# Patient Record
Sex: Male | Born: 2000 | Race: Black or African American | Hispanic: No | Marital: Single | Smoking: Never smoker
Health system: Southern US, Community
[De-identification: ages and names within clinical notes are randomized; demographics above are authoritative.]

## PROBLEM LIST (undated history)

## (undated) DIAGNOSIS — F319 Bipolar disorder, unspecified: Secondary | ICD-10-CM

---
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
## 2018-05-19 ENCOUNTER — Other Ambulatory Visit: Payer: Self-pay

## 2018-05-19 ENCOUNTER — Encounter (HOSPITAL_COMMUNITY): Payer: Self-pay | Admitting: *Deleted

## 2018-05-19 ENCOUNTER — Emergency Department (HOSPITAL_COMMUNITY): Payer: Medicaid Other

## 2018-05-19 ENCOUNTER — Emergency Department (HOSPITAL_COMMUNITY)
Admission: EM | Admit: 2018-05-19 | Discharge: 2018-05-19 | Disposition: A | Discharge status: Home / Self Care | Payer: Medicaid Other | Attending: Emergency Medicine | Admitting: Emergency Medicine

## 2018-05-19 DIAGNOSIS — W109XXA Fall (on) (from) unspecified stairs and steps, initial encounter: Secondary | ICD-10-CM | POA: Diagnosis not present

## 2018-05-19 DIAGNOSIS — S93402A Sprain of unspecified ligament of left ankle, initial encounter: Secondary | ICD-10-CM | POA: Insufficient documentation

## 2018-05-19 DIAGNOSIS — Y9301 Activity, walking, marching and hiking: Secondary | ICD-10-CM | POA: Insufficient documentation

## 2018-05-19 DIAGNOSIS — Y998 Other external cause status: Secondary | ICD-10-CM | POA: Diagnosis not present

## 2018-05-19 DIAGNOSIS — Y9289 Other specified places as the place of occurrence of the external cause: Secondary | ICD-10-CM | POA: Diagnosis not present

## 2018-05-19 DIAGNOSIS — S99912A Unspecified injury of left ankle, initial encounter: Secondary | ICD-10-CM | POA: Diagnosis present

## 2018-05-19 MED ORDER — IBUPROFEN 200 MG PO TABS
400.0000 mg | ORAL_TABLET | Freq: Once | ORAL | Status: AC
  Administered 2018-05-19: 400 mg via ORAL
  Filled 2018-05-19: qty 2

## 2018-05-19 MED ORDER — IBUPROFEN 600 MG PO TABS: 600.0000 mg | ORAL_TABLET | Freq: Four times a day (QID) | ORAL | 0 refills | Status: AC | PRN

## 2018-05-19 NOTE — ED Provider Notes (Signed)
[facility]-EMERGENCY DEPT Provider Note   CSN: [id number] Arrival date & time: 05/19/18  1042     History   Chief Complaint Chief Complaint  Patient presents with  . Ankle Pain    HPI Bruce James is a 17 y.o. male who presents to the ED with ankle pain. Patient arrived via EMS and reports he was coming down steps and twisted his left ankle. He reports pain 5/10.   HPI  History reviewed. No pertinent past medical history.  There are no active problems to display for this patient.   History reviewed. No pertinent surgical history.      Home Medications    Prior to Admission medications   Medication Sig Start Date End Date Taking? Authorizing Provider  ibuprofen (ADVIL,MOTRIN) 600 MG tablet Take 1 tablet (600 mg total) by mouth every 6 (six) hours as needed. 05/19/18   Janne Napoleon, NP  naproxen (NAPROSYN) 375 MG tablet Take 1 tablet (375 mg total) by mouth 2 (two) times daily. Prn leg pain 06/10/16   Hayden Rasmussen, NP    Family History No family history on file.  Social History Social History   Tobacco Use  . Smoking status: Never Smoker  . Smokeless tobacco: Never Used  Substance Use Topics  . Alcohol use: No  . Drug use: No     Allergies   Patient has no known allergies.   Review of Systems Review of Systems  Musculoskeletal: Positive for arthralgias and joint swelling.       Left ankle pain  All other systems reviewed and are negative.    Physical Exam Updated Vital Signs BP (!) 131/42 (BP Location: Left Arm)   Pulse 65   Temp 98.8 F (37.1 C) (Oral)   Resp 16   Ht 5\' 10"  (1.778 m)   Wt 83.9 kg   SpO2 100%   BMI 26.54 kg/m   Physical Exam Vitals signs and nursing note reviewed.  Constitutional:      Appearance: He is well-developed.  HENT:     Head: Normocephalic.     Mouth/Throat:     Mouth: Mucous membranes are moist.  Eyes:     Extraocular Movements: Extraocular movements intact.  Neck:   Musculoskeletal: Neck supple.  Cardiovascular:     Rate and Rhythm: Normal rate.  Pulmonary:     Effort: Pulmonary effort is normal.  Musculoskeletal:     Left ankle: He exhibits swelling. He exhibits normal range of motion, no deformity, no laceration and normal pulse. Tenderness. Lateral malleolus tenderness found. Achilles tendon normal.     Comments: Pedal pulse 2+, adequate circulation  Skin:    General: Skin is warm and dry.  Neurological:     Mental Status: He is alert.     Sensory: No sensory deficit.     Motor: No weakness.  Psychiatric:        Mood and Affect: Mood normal.      ED Treatments / Results  Labs (all labs ordered are listed, but only abnormal results are displayed) Labs Reviewed - No data to display  Radiology Dg Ankle Complete Left  Result Date: 05/19/2018 CLINICAL DATA:  Left ankle swelling after injury coming down steps. EXAM: LEFT ANKLE COMPLETE - 3+ VIEW COMPARISON:  None. FINDINGS: There is no evidence of fracture, dislocation, or joint effusion. There is no evidence of arthropathy or other focal bone abnormality. Soft tissue swelling is seen over lateral malleolus. IMPRESSION: No fracture or dislocation is  noted. Soft tissue swelling seen over lateral malleolus. Electronically Signed   By: Lupita RaiderJames  Green Jr, M.D.   On: 05/19/2018 11:35    Procedures Procedures (including critical care time)  Medications Ordered in ED Medications  ibuprofen (ADVIL,MOTRIN) tablet 400 mg (has no administration in time range)     Initial Impression / Assessment and Plan / ED Course  I have reviewed the triage vital signs and the nursing notes. 17 y.o. male here with left ankle pain s/p injury stable for d/c without fracture or dislocation noted on x-ray. ASO, crutches, ice, elevation and ibuprofen. Discussed return precautions. Patient agrees with plan. No focal neuro deficits.   Final Clinical Impressions(s) / ED Diagnoses   Final diagnoses:  Sprain of left  ankle, unspecified ligament, initial encounter    ED Discharge Orders         Ordered    ibuprofen (ADVIL,MOTRIN) 600 MG tablet  Every 6 hours PRN     05/19/18 1215           Kerrie Buffaloeese,  RuffinM, NP 05/19/18 1221    Azalia Bilisampos, Kevin, MD 05/20/18 [phone]

## 2018-05-19 NOTE — ED Notes (Signed)
Pt has left ankle swelling 5/10 pain at rest.  Pt has PMS to extremity.

## 2018-05-19 NOTE — ED Triage Notes (Signed)
EMS states pt was coming down steps and twisted left ankle, #18 LAC, refused pain meds, Pain 5/10 splint applied

## 2018-07-01 ENCOUNTER — Ambulatory Visit (HOSPITAL_COMMUNITY)
Admission: EM | Admit: 2018-07-01 | Discharge: 2018-07-01 | Disposition: A | Discharge status: Home / Self Care | Payer: Medicaid Other | Attending: Internal Medicine | Admitting: Internal Medicine

## 2018-07-01 ENCOUNTER — Ambulatory Visit (INDEPENDENT_AMBULATORY_CARE_PROVIDER_SITE_OTHER): Payer: Medicaid Other

## 2018-07-01 ENCOUNTER — Other Ambulatory Visit: Payer: Self-pay

## 2018-07-01 ENCOUNTER — Encounter (HOSPITAL_COMMUNITY): Payer: Self-pay | Admitting: Emergency Medicine

## 2018-07-01 DIAGNOSIS — S93402A Sprain of unspecified ligament of left ankle, initial encounter: Secondary | ICD-10-CM

## 2018-07-01 DIAGNOSIS — M25572 Pain in left ankle and joints of left foot: Secondary | ICD-10-CM

## 2018-07-01 NOTE — ED Triage Notes (Signed)
The patient presented to the [facility] with a complaint of left ankle pain and swelling after falling down some stairs and then falling during basketball one day ago.

## 2018-07-01 NOTE — Discharge Instructions (Signed)
You may take 500mg Tylenol with ibuprofen 400-600mg every 6 hours for pain and inflammation. °

## 2018-07-01 NOTE — ED Provider Notes (Signed)
  MRN: [id number] DOB: [birth year]  Subjective:   Bruce James is a 18 y.o. male presenting for 2 left ankle injuries, 1 sustained from falling down a few steps and another from playing basketball.  He has since had worsening, moderate aching pain over either side of his left ankle.  Has not tried any medications for pain relief.  He has done some icing. He is not currently taking any medications and has no known food or drug allergies.  Denies past medical and surgical history.  ROS Fever, redness, warmth, bony deformity, loss of sensation, bruising.  Objective:   Vitals: BP (!) 122/52 (BP Location: Right Arm)   Pulse 76   Temp 98.8 F (37.1 C) (Oral)   Resp 16   SpO2 100%   Physical Exam Constitutional:      Appearance: Normal appearance. He is well-developed and normal weight.  HENT:     Head: Normocephalic and atraumatic.     Right Ear: External ear normal.     Left Ear: External ear normal.     Nose: Nose normal.     Mouth/Throat:     Pharynx: Oropharynx is clear.  Eyes:     Extraocular Movements: Extraocular movements intact.     Pupils: Pupils are equal, round, and reactive to light.  Cardiovascular:     Rate and Rhythm: Normal rate.  Pulmonary:     Effort: Pulmonary effort is normal.  Musculoskeletal:     Left ankle: He exhibits decreased range of motion (Mild) and swelling (Mostly over the lateral malleolus). He exhibits no ecchymosis, no deformity, no laceration and normal pulse. Tenderness. Lateral malleolus and medial malleolus tenderness found. Achilles tendon exhibits no pain, no defect and normal Thompson's test results.  Neurological:     Mental Status: He is alert and oriented to person, place, and time.  Psychiatric:        Mood and Affect: Mood normal.        Behavior: Behavior normal.    Dg Ankle Complete Left  Result Date: 07/01/2018 CLINICAL DATA:  Left ankle injury post fall. EXAM: LEFT ANKLE COMPLETE - 3+ VIEW COMPARISON:  None. FINDINGS: There  is no evidence of fracture, dislocation, or joint effusion. There is no evidence of arthropathy or other focal bone abnormality. Soft tissue swelling about the lateral malleolus. IMPRESSION: No acute fracture or dislocation identified about the left ankle. Electronically Signed   By: Ted Mcalpine M.D.   On: 07/01/2018 17:55    Assessment and Plan :   Acute left ankle pain  Sprain of left ankle, unspecified ligament, initial encounter  No fracture noted on preliminary reading, overread is pending.  Will use rice method, provided patient with Ace wrap. Counseled patient on potential for adverse effects with medications prescribed today, patient verbalized understanding. Return-to-clinic precautions discussed, patient verbalized understanding.      Wallis Bamberg, PA-C 07/01/18 1758

## 2018-11-30 ENCOUNTER — Ambulatory Visit: Payer: Medicaid Other | Admitting: Internal Medicine

## 2018-12-01 ENCOUNTER — Encounter (HOSPITAL_COMMUNITY): Payer: Self-pay

## 2018-12-01 ENCOUNTER — Inpatient Hospital Stay (HOSPITAL_COMMUNITY)
Admission: RE | Admit: 2018-12-01 | Discharge: 2018-12-07 | DRG: 885 | Disposition: A | Discharge status: Home / Self Care | Payer: Medicaid Other | Attending: Psychiatry | Admitting: Psychiatry

## 2018-12-01 ENCOUNTER — Other Ambulatory Visit: Payer: Self-pay

## 2018-12-01 DIAGNOSIS — F23 Brief psychotic disorder: Secondary | ICD-10-CM | POA: Insufficient documentation

## 2018-12-01 DIAGNOSIS — F209 Schizophrenia, unspecified: Principal | ICD-10-CM | POA: Diagnosis present

## 2018-12-01 DIAGNOSIS — G4700 Insomnia, unspecified: Secondary | ICD-10-CM | POA: Diagnosis present

## 2018-12-01 DIAGNOSIS — Z1159 Encounter for screening for other viral diseases: Secondary | ICD-10-CM

## 2018-12-01 DIAGNOSIS — F6381 Intermittent explosive disorder: Secondary | ICD-10-CM | POA: Diagnosis present

## 2018-12-01 DIAGNOSIS — F4325 Adjustment disorder with mixed disturbance of emotions and conduct: Secondary | ICD-10-CM | POA: Diagnosis present

## 2018-12-01 MED ORDER — LORAZEPAM 2 MG/ML IJ SOLN
2.0000 mg | Freq: Four times a day (QID) | INTRAMUSCULAR | Status: DC | PRN
  Administered 2018-12-01: 2 mg via INTRAMUSCULAR
  Filled 2018-12-01: qty 1

## 2018-12-01 MED ORDER — HALOPERIDOL 5 MG PO TABS
5.0000 mg | ORAL_TABLET | Freq: Four times a day (QID) | ORAL | Status: DC | PRN
  Administered 2018-12-02 – 2018-12-06 (×4): 5 mg via ORAL
  Filled 2018-12-01 (×5): qty 1

## 2018-12-01 MED ORDER — DIPHENHYDRAMINE HCL 25 MG PO CAPS
50.0000 mg | ORAL_CAPSULE | Freq: Four times a day (QID) | ORAL | Status: DC | PRN
  Administered 2018-12-02 – 2018-12-05 (×4): 50 mg via ORAL
  Filled 2018-12-01 (×4): qty 2

## 2018-12-01 MED ORDER — DIPHENHYDRAMINE HCL 50 MG/ML IJ SOLN
50.0000 mg | Freq: Four times a day (QID) | INTRAMUSCULAR | Status: DC | PRN
  Administered 2018-12-01: 50 mg via INTRAMUSCULAR
  Filled 2018-12-01: qty 1

## 2018-12-01 MED ORDER — TRAZODONE HCL 50 MG PO TABS: 50.0000 mg | ORAL_TABLET | Freq: Every evening | ORAL | Status: DC | PRN

## 2018-12-01 MED ORDER — MAGNESIUM HYDROXIDE 400 MG/5ML PO SUSP: 30.0000 mL | Freq: Every day | ORAL | Status: DC | PRN

## 2018-12-01 MED ORDER — ALUM & MAG HYDROXIDE-SIMETH 200-200-20 MG/5ML PO SUSP: 30.0000 mL | ORAL | Status: DC | PRN

## 2018-12-01 MED ORDER — LORAZEPAM 1 MG PO TABS
2.0000 mg | ORAL_TABLET | Freq: Four times a day (QID) | ORAL | Status: DC | PRN
  Administered 2018-12-02 – 2018-12-06 (×6): 2 mg via ORAL
  Filled 2018-12-01 (×6): qty 2

## 2018-12-01 MED ORDER — HYDROXYZINE HCL 50 MG PO TABS
50.0000 mg | ORAL_TABLET | Freq: Three times a day (TID) | ORAL | Status: DC | PRN
  Administered 2018-12-03 – 2018-12-06 (×5): 50 mg via ORAL
  Filled 2018-12-01 (×4): qty 1

## 2018-12-01 MED ORDER — ACETAMINOPHEN 325 MG PO TABS: 650.0000 mg | ORAL_TABLET | Freq: Four times a day (QID) | ORAL | Status: DC | PRN

## 2018-12-01 MED ORDER — HALOPERIDOL LACTATE 5 MG/ML IJ SOLN
10.0000 mg | Freq: Four times a day (QID) | INTRAMUSCULAR | Status: DC | PRN
  Administered 2018-12-01: 10 mg via INTRAMUSCULAR
  Filled 2018-12-01: qty 2

## 2018-12-01 NOTE — H&P (Signed)
Behavioral Health Medical Screening Exam  Bruce James is an 18 y.o. male.  Total Time spent with patient: 20 minutes  Psychiatric Specialty Exam: Physical Exam  Nursing note and vitals reviewed. Constitutional: He is oriented to person, place, and time. He appears well-developed and well-nourished.  Cardiovascular: Normal rate.  Respiratory: Effort normal.  Musculoskeletal: Normal range of motion.  Neurological: He is alert and oriented to person, place, and time.  Skin: Skin is warm.    Review of Systems  Constitutional: Negative.   HENT: Negative.   Eyes: Negative.   Respiratory: Negative.   Cardiovascular: Negative.   Gastrointestinal: Negative.   Genitourinary: Negative.   Musculoskeletal: Negative.   Skin: Negative.   Neurological: Negative.   Endo/Heme/Allergies: Negative.   Psychiatric/Behavioral: The patient is nervous/anxious.        Anger, homicidal ideations    There were no vitals taken for this visit.There is no height or weight on file to calculate BMI.  General Appearance: Guarded  Eye Contact:  Fair  Speech:  Clear and Coherent and Normal Rate  Volume:  Increased  Mood:  Angry  Affect:  Congruent  Thought Process:  Disorganized and Descriptions of Associations: Tangential  Orientation:  Full (Time, Place, and Person)  Thought Content:  Illogical  Suicidal Thoughts:  No  Homicidal Thoughts:  patient denies but it is reported in IVC paperwork  Memory:  Immediate;   Fair Recent;   Fair  Judgement:  Impaired  Insight:  Lacking  Psychomotor Activity:  Increased  Concentration: Concentration: Poor  Recall:  [place]  Language: Good  Akathisia:  No  Handed:  Right  AIMS (if indicated):     Assets:  Physical Health Social Support  Sleep:       Musculoskeletal: Strength & Muscle Tone: within normal limits Gait & Station: normal Patient leans: N/A  There were no vitals taken for this visit.  Recommendations:  Based on  my evaluation the patient does not appear to have an emergency medical condition.  Lewis Shock, FNP 12/01/2018, 4:49 PM

## 2018-12-01 NOTE — BH Assessment (Signed)
[facility] Assessment Progress Note Case was staffed with Money NP who recommended inpatient admission to assist with stabilization.

## 2018-12-01 NOTE — Progress Notes (Signed)
Pt sleeping at present, no distress noted, calm at present, sedated.  Respirations even and unlabored, skin color good.  Monitoring for safety.

## 2018-12-01 NOTE — Plan of Care (Signed)
[facility] Observation Crisis Plan  Reason for Crisis Plan:  Crisis Stabilization   Plan of Care:  Referral for Telepsychiatry/Psychiatric Consult  Family Support:  Yes   Current Living Environment:  Living Arrangements: Parent  Insurance:   Hospital Account    Name Acct ID Class Status Primary Coverage   [place], [place] [id number] [place]        Guarantor Account (for Hospital Account [id number])    Name Relation to Pt Service Area Active? Acct Type   [place], [place]   Address Phone       [address] [place], [place] [address] [phone])          Coverage Information (for Hospital Account [id number])    F/O Payor/Plan Precert #   [facility] MEDICAID/[place] MEDICAID    Subscriber Subscriber #   [place], [place] [id number]   Address Phone   [address] [place], [place] [address] [phone]      Legal Guardian:  Legal Guardian: (NA)  Primary Care Provider:  Patient, No Pcp Per  Current Outpatient Providers:   Psychiatrist:  Name of Psychiatrist: None  Counselor/Therapist:  Name of Therapist: None  Compliant with Medications:  No medications  Additional Information:   Clarita Crane 6/27/20206:24 PM

## 2018-12-01 NOTE — Progress Notes (Signed)
Pt agitated, anxious, very untrusting of staff.  Paranoid, stating the system is against him, cursing at white staff members.  Staff at bedside to de-escalate pt.   Pt took IM injections without incident.  Pt eating a snack at present.  PA Spencer at bedside to speak with pt.
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
## 2018-12-02 DIAGNOSIS — F4325 Adjustment disorder with mixed disturbance of emotions and conduct: Secondary | ICD-10-CM | POA: Diagnosis not present

## 2018-12-02 DIAGNOSIS — F6381 Intermittent explosive disorder: Secondary | ICD-10-CM

## 2018-12-02 LAB — CBC
HCT: 46 % (ref 39.0–52.0)
Hemoglobin: 15.6 g/dL (ref 13.0–17.0)
MCH: 30.4 pg (ref 26.0–34.0)
MCHC: 33.9 g/dL (ref 30.0–36.0)
MCV: 89.7 fL (ref 80.0–100.0)
Platelets: 273 10*3/uL (ref 150–400)
RBC: 5.13 MIL/uL (ref 4.22–5.81)
RDW: 12.3 % (ref 11.5–15.5)
WBC: 4.1 10*3/uL (ref 4.0–10.5)
nRBC: 0 % (ref 0.0–0.2)

## 2018-12-02 LAB — COMPREHENSIVE METABOLIC PANEL
ALT: 41 U/L (ref 0–44)
AST: 95 U/L — ABNORMAL HIGH (ref 15–41)
Albumin: 5 g/dL (ref 3.5–5.0)
Alkaline Phosphatase: 106 U/L (ref 38–126)
Anion gap: 9 (ref 5–15)
BUN: 10 mg/dL (ref 6–20)
CO2: 25 mmol/L (ref 22–32)
Calcium: 9.5 mg/dL (ref 8.9–10.3)
Chloride: 101 mmol/L (ref 98–111)
Creatinine, Ser: 1.23 mg/dL (ref 0.61–1.24)
GFR calc Af Amer: >60 mL/min (ref >60)
GFR calc non Af Amer: >60 mL/min (ref >60)
Glucose, Bld: 89 mg/dL (ref 70–99)
Potassium: 3.8 mmol/L (ref 3.5–5.1)
Sodium: 135 mmol/L (ref 135–145)
Total Bilirubin: 1.7 mg/dL — ABNORMAL HIGH (ref 0.3–1.2)
Total Protein: 7.9 g/dL (ref 6.5–8.1)

## 2018-12-02 LAB — TSH: TSH: 1.15 u[IU]/mL (ref 0.350–4.500)

## 2018-12-02 LAB — HEMOGLOBIN A1C
Hgb A1c MFr Bld: 5.1 % (ref 4.8–5.6)
Mean Plasma Glucose: 99.67 mg/dL

## 2018-12-02 MED ORDER — TRAZODONE HCL 50 MG PO TABS
50.0000 mg | ORAL_TABLET | Freq: Every day | ORAL | Status: DC
  Administered 2018-12-03 – 2018-12-06 (×4): 50 mg via ORAL
  Filled 2018-12-02 (×4): qty 1

## 2018-12-02 MED ORDER — CARBAMAZEPINE ER 100 MG PO TB12
200.0000 mg | ORAL_TABLET | Freq: Every day | ORAL | Status: DC
  Administered 2018-12-02 – 2018-12-03 (×2): 200 mg via ORAL
  Filled 2018-12-02 (×2): qty 2

## 2018-12-02 NOTE — Progress Notes (Signed)
Patient ID: ERVIE MCCARD, male   DOB: Apr 08, 2001, 18 y.o.   MRN: [id number]   D:Patient has been out of his room in milieu. He was noted punching mattress this morning. Patient does not feel he needs to be in hospital. Patient agreed to take medication this morning. Patient denies SI/HI and A/V hallucinations. Patient engaged very little in conversation with this Probation officer. Patient appears to be preoccupied in thoughts; however denies hearing voices.  A:Patient taking medications as prescribed. Patient provided support and encouragement. Q 15 minute checks in progress.  R:Patient remains safe on unit. Monitoring continues.

## 2018-12-02 NOTE — H&P (Addendum)
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 male that presents this date with IVC. Per IVC: "[name] is hostile and aggressive. The respondent is threatening and states he is going to kill people and break their necks. The respondent cries for long periods of time because of "all the pain in world". [name] punches a concrete wall in his residence "over and over." [name] is a danger to himself and others." Patient presents with a very guarded affect and renders limited history. Patient is difficult to redirect and tangential. Patient is observed to be very agitated and speaks in a loud pressured voice. When asked if patient is oriented patient states "does it matter." Patient denies any S/I but reports ongoing H/I stating he "will kill anyone who is in his way." Patient will not elaborate on content of statement. As this Clinical research associatewriter attempts to gather history patient has a leaf in his pocket which he shows to this Clinical research associatewriter and asks "do you see the beauty of God." Patient makes several religious statements that are unrelated to questions asked. Patient states "there is so much beauty in the world." As this Clinical research associatewriter attempts to gather information patient is observed to attempt to leave the triage area stating "I am done." Patient is observed to be very angry and clinches his fists stating "we are done here." Information to complete assessment was obtained from parent who is present (mother Bruce James [phone]) who renders collateral. Mother states patient is in his  second year at [employer] and is currently on summer break and residing with her. Mother states patient has never had any previous attempts at self harm SA use or prior mental health diagnoses. Mother states that patient's behaviors started to change over two weeks ago when patient suffered a break up with partner. Mother also reports that patient has been extremely upset over the fear of COVID and the [place] incident. Mother reports patient has been isolating and has not slept in over one week. Mother reports that patient has never displayed any aggressive behaviors in the past and has been recently "punching a brick wall outside his home." Mother reports patient has been religiously fixed and often makes bizarre statements that are associated with God and the Bible. Mother states patient as of today showed her a Face book post on how he intends to "kill the Police" over recent events. Mother reports at that time she initiated a IVC. Case was staffed with Money NP who recommended inpatient admission to assist with stabilization.                  .    DUring the evaluation:  Patient was seen and chart reviewed. Patient stated that he believes his neighbor called the police on him because she was worried about his behavior. "I was working out and I was punching the wall on Instagram Live and I guess someone got worried. But that is how I work out. I live with my 8 siblings and they want to go pro so I rough them up and hit on them a lot to get them trong. I also have to work  out to keep myself strong for them. I could have went pro too but I stayed for my brothers. I love my fans I gotta give my fans something to keep them motivated. " Patient denies any depression, anxiety, mood swings, or psychosis. He denies any any inpatient admission or outpatient services. He denies si/hi/avh.   Does contract for safety. No agitation or aggression observed while on the unit.    Associated Signs/Symptoms: Depression  Symptoms:  denies (Hypo) Manic Symptoms:  Elevated Mood, Impulsivity, Irritable Mood, Labiality of Mood, Anxiety Symptoms:  denies Psychotic Symptoms:  denies PTSD Symptoms: Negative Total Time spent with patient: 30 minutes  Past Psychiatric History:None  Is the patient at risk to self? Yes.    Has the patient been a risk to self in the past 6 months? Yes.    Has the patient been a risk to self within the distant past? No.  Is the patient a risk to others? No.  Has the patient been a risk to others in the past 6 months? No.  Has the patient been a risk to others within the distant past? No.   Prior Inpatient Therapy: Prior Inpatient Therapy: No Prior Outpatient Therapy: Prior Outpatient Therapy: No Does patient have an ACCT team?: No Does patient have Intensive In-House Services?  : No Does patient have [facility] services? : No Does patient have P4CC services?: No  Alcohol Screening: 1. How often do you have a drink containing alcohol?: Never 2. How many drinks containing alcohol do you have on a typical day when you are drinking?: 1 or 2 3. How often do you have six or more drinks on one occasion?: Never AUDIT-C Score: 0 4. How often during the last year have you found that you were not able to stop drinking once you had started?: Never 5. How often during the last year have you failed to do what was normally expected from you becasue of drinking?: Never 6. How often during the last year have you needed a first drink in the morning to get yourself going after a heavy drinking session?: Never 7. How often during the last year have you had a feeling of guilt of remorse after drinking?: Never 8. How often during the last year have you been unable to remember what happened the night before because you had been drinking?: Never 9. Have you or someone else been injured as a result of your drinking?: No 10. Has a relative or friend or a doctor or another health worker been concerned  about your drinking or suggested you cut down?: No Alcohol Use Disorder Identification Test Final Score (AUDIT): 0 Substance Abuse History in the last 12 months:  Yes.   Consequences of Substance Abuse: psychosis Previous Psychotropic Medications: No  Psychological Evaluations: No  Past Medical History: History reviewed. No pertinent past medical history. History reviewed. No pertinent surgical history. Family History: History reviewed. No pertinent family history. Family Psychiatric History: as per patient "not that I am aware of but my mom tends to say some crazy things. "  Tobacco Screening:   Social History:  Social History   Substance and Sexual Activity  Alcohol Use No     Social History   Substance and Sexual Activity  Drug Use No    Additional Social History: Marital status: Single    Pain Medications: See MAR Prescriptions: See MAR Over the Counter: See MAR History of alcohol / drug use?: No history of alcohol / drug abuse  Allergies:  No Known Allergies Lab Results:  Results for orders placed or performed during the hospital encounter of 12/01/18 (from the past 48 hour(s))  CBC     Status: None   Collection Time: 12/02/18  7:23 AM  Result Value Ref Range   WBC 4.1 4.0 - 10.5 K/uL   RBC 5.13 4.22 - 5.81 MIL/uL   Hemoglobin 15.6 13.0 - 17.0 g/dL   HCT 46.0 39.0 - 52.0 %   MCV 89.7 80.0 - 100.0 fL   MCH 30.4 26.0 - 34.0 pg   MCHC 33.9 30.0 - 36.0 g/dL   RDW 12.3 11.5 - 15.5 %   Platelets 273 150 - 400 K/uL   nRBC 0.0 0.0 - 0.2 %    Comment: Performed at [facility], [place] [address] [place], [place] [address]  Comprehensive metabolic panel     Status: Abnormal   Collection Time: 12/02/18  7:23 AM  Result Value Ref Range   Sodium 135 135 - 145 mmol/L   Potassium 3.8 3.5 - 5.1 mmol/L   Chloride 101 98 - 111 mmol/L   CO2 25 22 - 32 mmol/L   Glucose, Bld 89 70 - 99 mg/dL   BUN 10 6 - 20 mg/dL   Creatinine, Ser  1.23 0.61 - 1.24 mg/dL   Calcium 9.5 8.9 - 10.3 mg/dL   Total Protein 7.9 6.5 - 8.1 g/dL   Albumin 5.0 3.5 - 5.0 g/dL   AST 95 (H) 15 - 41 U/L   ALT 41 0 - 44 U/L   Alkaline Phosphatase 106 38 - 126 U/L   Total Bilirubin 1.7 (H) 0.3 - 1.2 mg/dL   GFR calc non Af Amer >60 >60 mL/min   GFR calc Af Amer >60 >60 mL/min   Anion gap 9 5 - 15    Comment: Performed at [facility], [address], [place], [place] [address]  TSH     Status: None   Collection Time: 12/02/18  7:23 AM  Result Value Ref Range   TSH 1.150 0.350 - 4.500 uIU/mL    Comment: Performed by a 3rd Generation assay with a functional sensitivity of <=0.01 uIU/mL. Performed at [facility], [address], [place], [place] [address]   Hemoglobin A1c     Status: None   Collection Time: 12/02/18  7:23 AM  Result Value Ref Range   Hgb A1c MFr Bld 5.1 4.8 - 5.6 %    Comment: (NOTE) Pre diabetes:          5.7%-6.4% Diabetes:              >6.4% Glycemic control for   <7.0% adults with diabetes    Mean Plasma Glucose 99.67 mg/dL    Comment: Performed at [facility] [address] [place], [place] [address]    Blood Alcohol level:  No results found for: [facility]  Metabolic Disorder Labs:  Lab Results  Component Value Date   HGBA1C 5.1 12/02/2018   MPG 99.67 12/02/2018   No results found for: PROLACTIN No results found for: CHOL, TRIG, HDL, CHOLHDL, VLDL, LDLCALC  Current Medications: Current Facility-Administered Medications  Medication Dose Route Frequency Provider Last Rate Last Dose  . acetaminophen (TYLENOL) tablet 650 mg  650 mg Oral Q6H PRN Money, Lowry Ram, FNP      . alum & mag hydroxide-simeth (MAALOX/MYLANTA) 200-200-20 MG/5ML suspension 30 mL  30 mL Oral Q4H PRN Money, Lowry Ram, FNP      .  carbamazepine (TEGRETOL XR) 12 hr tablet 200 mg  200 mg Oral Daily Mikah Poss, MD   200 mg at 12/02/18 1247  . diphenhydrAMINE (BENADRYL) capsule 50 mg  50 mg Oral Q6H  PRN Money, Gerlene Burdock, FNP       Or  . diphenhydrAMINE (BENADRYL) injection 50 mg  50 mg Intramuscular Q6H PRN Money, Feliz Beam B, FNP   50 mg at 12/01/18 2032  . haloperidol (HALDOL) tablet 5 mg  5 mg Oral Q6H PRN Money, Gerlene Burdock, FNP       Or  . haloperidol lactate (HALDOL) injection 10 mg  10 mg Intramuscular Q6H PRN Money, Gerlene Burdock, FNP   10 mg at 12/01/18 2032  . hydrOXYzine (ATARAX/VISTARIL) tablet 50 mg  50 mg Oral TID PRN Money, Gerlene Burdock, FNP      . LORazepam (ATIVAN) tablet 2 mg  2 mg Oral Q6H PRN Money, Gerlene Burdock, FNP       Or  . LORazepam (ATIVAN) injection 2 mg  2 mg Intramuscular Q6H PRN Money, Gerlene Burdock, FNP   2 mg at 12/01/18 2032  . magnesium hydroxide (MILK OF MAGNESIA) suspension 30 mL  30 mL Oral Daily PRN Money, Feliz Beam B, FNP      . traZODone (DESYREL) tablet 50 mg  50 mg Oral QHS London Nonaka, MD       PTA Medications: Medications Prior to Admission  Medication Sig Dispense Refill Last Dose  . ibuprofen (ADVIL,MOTRIN) 600 MG tablet Take 1 tablet (600 mg total) by mouth every 6 (six) hours as needed. (Patient not taking: Reported on 12/02/2018) 20 tablet 0 Not Taking at Unknown time  . naproxen (NAPROSYN) 375 MG tablet Take 1 tablet (375 mg total) by mouth 2 (two) times daily. Prn leg pain (Patient not taking: Reported on 12/02/2018) 20 tablet 0 Not Taking at Unknown time    Musculoskeletal: Strength & Muscle Tone: within normal limits Gait & Station: normal Patient leans: N/A  Psychiatric Specialty Exam: Physical Exam  ROS  Blood pressure (!) 162/85, pulse (!) 111, temperature (!) 97.4 F (36.3 C), temperature source Oral, resp. rate 18, SpO2 98 %.There is no height or weight on file to calculate BMI.  General Appearance: Disheveled and purple scrubs torn   Eye Contact:  Fair  Speech:  Clear and Coherent and Pressured  Volume:  Increased  Mood:  Irritable  Affect:  Congruent  Thought Process:  Irrelevant, Linear and Descriptions of Associations: Intact   Orientation:  Full (Time, Place, and Person)  Thought Content:  Delusions and Tangential  Suicidal Thoughts:  No  Homicidal Thoughts:  No  Memory:  Immediate;   Fair Recent;   Fair  Judgement:  Impaired  Insight:  Shallow  Psychomotor Activity:  Normal  Concentration:  Concentration: Fair and Attention Span: Fair  Recall:  [employer] of Knowledge:  Fair  Language:  Fair  Akathisia:  No  Handed:  Right  AIMS (if indicated):     Assets:  Architect Housing Leisure Time Physical Health Social Support Vocational/Educational  ADL's:  Intact  Cognition:  Impaired,  Mild  Sleep:         Treatment Plan Summary: Daily contact with patient to assess and evaluate symptoms and progress in treatment, Medication management and Plan continue with medications and prn agitation protocol as needed. WIll continue to recommend inpatient 500 hall.   Observation Level/Precautions:  15 minute checks Laboratory:  Labs obtained and assessed.  Psychotherapy:  Medications:  Tegretol 200mg  po daily.  Consultations:  Per need Discharge Concerns:  None Estimated LOS:5-7 days Other:      Maryagnes Amosakia S Starkes-Perry, FNP 6/28/20206:20 PM  Patient seen face-to-face for psychiatric evaluation, chart reviewed and case discussed with the physician extender and developed treatment plan. Reviewed the information documented and agree with the treatment plan. Thedore MinsMojeed Reegan Mctighe, MD

## 2018-12-02 NOTE — Progress Notes (Signed)
Pt remains sleeping at present, no distress noted, calm at present.  Monitoring for safety.

## 2018-12-02 NOTE — Progress Notes (Signed)
Patient meets criteria for inpatient treatment. No appropriate or available beds at [facility]. CSW faxed referrals to the following facilities for review:  [facility], [facility], [facility]), [facility], [facility], [facility], and [facility].  TTS will continue to seek bed placement.  Chalmers Guest. Guerry Bruin, MSW, [employer] Work/Disposition Phone: [phone] Fax: [phone]

## 2018-12-03 DIAGNOSIS — F23 Brief psychotic disorder: Secondary | ICD-10-CM | POA: Diagnosis not present

## 2018-12-03 LAB — RAPID URINE DRUG SCREEN, HOSP PERFORMED
Amphetamines: NOT DETECTED
Barbiturates: NOT DETECTED
Benzodiazepines: NOT DETECTED
Cocaine: NOT DETECTED
Opiates: NOT DETECTED
Tetrahydrocannabinol: NOT DETECTED

## 2018-12-03 LAB — URINALYSIS, ROUTINE W REFLEX MICROSCOPIC
Bilirubin Urine: NEGATIVE
Glucose, UA: NEGATIVE mg/dL
Hgb urine dipstick: NEGATIVE
Ketones, ur: NEGATIVE mg/dL
Leukocytes,Ua: NEGATIVE
Nitrite: NEGATIVE
Protein, ur: NEGATIVE mg/dL
Specific Gravity, Urine: 1.001 — ABNORMAL LOW (ref 1.005–1.030)
pH: 6 (ref 5.0–8.0)

## 2018-12-03 MED ORDER — HALOPERIDOL 5 MG PO TABS
5.0000 mg | ORAL_TABLET | Freq: Every day | ORAL | Status: DC
  Administered 2018-12-03 – 2018-12-06 (×4): 5 mg via ORAL
  Filled 2018-12-03 (×5): qty 1

## 2018-12-03 NOTE — Progress Notes (Signed)
Provided specimen cup for urine sample when he can.  Cup was placed in his bathroom with instructions.  We will continue to encourage specimen.
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
## 2018-12-03 NOTE — Progress Notes (Signed)
Bruce James has been up and down all shift.  He would come out randomly telling staff that he was mad because he couldn't have a "rolly chair" and that he couldn't have melatonin at 4am.  He remained restless and bizarre.  Offered more prn's at 4am but pt declined.

## 2018-12-03 NOTE — Progress Notes (Signed)
Pt mother, Joelene Millin, called and reports that he called her at 7pm this evening and she reports that he is sounding better and talking more clearly.

## 2018-12-03 NOTE — Progress Notes (Signed)
D:  Trevontae was up and pacing the hall much of the evening.  He had odd intimidating posture while pacing the unit.  He was noted standing in his room naked and opened the door with blanket covering his groin area.  He was initially receptive to medication and speaking with RN, but became agitated and verbally aggressive when RN asked to show under his tongue for cheeking.  RN would not leave side while tech had to get in a warm cup of water cause "cold water hurts my teeth."  He was verbally aggressive and making derogatory state blaming RN for having cold water.  He did spit some of the disintegrated pills in his hand but did finally take the medication.  He is currently resting with his eyes closed and appears to be asleep. A:  1:1 with RN for support and encouragement.  Medications as ordered.  Q 15 minute checks maintained for safety.  R:  Mordche remains safe on the unit.  We will continue to monitor the progress towards his goals.

## 2018-12-03 NOTE — Progress Notes (Signed)
Pt has been up and down.  He has been bizarre, walking like a robot, pretending to sleep walk into the back door, fake snoring and asking staff if they want to "race."  Medication had minimal effect.  We will continue to monitor.
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
## 2018-12-04 ENCOUNTER — Encounter (HOSPITAL_COMMUNITY): Payer: Self-pay | Admitting: Registered Nurse

## 2018-12-04 ENCOUNTER — Inpatient Hospital Stay: Admission: AD | Admit: 2018-12-04 | Payer: Medicaid Other | Admitting: Psychiatry

## 2018-12-04 LAB — SARS CORONAVIRUS 2 BY RT PCR (HOSPITAL ORDER, PERFORMED IN ~~LOC~~ HOSPITAL LAB): SARS Coronavirus 2: NEGATIVE

## 2018-12-04 NOTE — Tx Team (Signed)
Initial Treatment Plan 12/04/2018 11:09 PM Bruce James APO:[id number]    PATIENT STRESSORS: Marital or family conflict Substance abuse   PATIENT STRENGTHS: General fund of knowledge Physical Health Supportive family/friends   PATIENT IDENTIFIED PROBLEMS: Psychosis  Agitation/aggression  "I need help working on my anger issues"                 DISCHARGE CRITERIA:  Improved stabilization in mood, thinking, and/or behavior Need for constant or close observation no longer present Verbal commitment to aftercare and medication compliance  PRELIMINARY DISCHARGE PLAN: Outpatient therapy Medication management  PATIENT/FAMILY INVOLVEMENT: This treatment plan has been presented to and reviewed with the patient, Bruce James.  The patient and family have been given the opportunity to ask questions and make suggestions.  Windell Moment, RN 12/04/2018, 11:09 PM

## 2018-12-04 NOTE — Plan of Care (Signed)
[name] Observation Crisis Plan  Reason for Crisis Plan:  Crisis Stabilization   Plan of Care:  Referral for Inpatient Hospitalization  Family Support:      Current Living Environment:  Living Arrangements: Parent  Insurance:   Hospital Account    Name Acct ID Class Status Primary Coverage   Bruce James, Bruce James [id number] [place]        Guarantor Account (for Hospital Account [id number])    Name Relation to Pt Service Area Active? Acct Type   [name], [place]   Address Phone       [address] [place], [place] [address] [phone])          Coverage Information (for Hospital Account [id number])    F/O Payor/Plan Precert #   [facility] MEDICAID/[place]ICA[place]    Subscriber Subscriber #   Bruce James [id number]   Address Phone   [address] [place], [place] [address] [phone]      Legal Guardian:  Legal Guardian: (NA)  Primary Care Provider:  Patient, No Pcp Per  Current Outpatient Providers:  Unknown at this time.   Psychiatrist:  Name of Psychiatrist: None  Counselor/Therapist:  Name of Therapist: None  Compliant with Medications:  Yes  Additional Information:   Bruce James 6/30/20204:42 PM

## 2018-12-04 NOTE — Progress Notes (Signed)
Bruce James is a 18 year old male being transferred from [facility] Obs unit to 507-1.  He came in for psychosis and agitation after being IVC'd by his mother.  During Obs stay, he continued to exhibit bizarre behavior, pacing, robotic walking and irritability.  He takes his PO medications without difficulty.  He denied SI/HI or A/V hallucinations.  Oriented him to the unit.  Admission paperwork completed and signed.  Belongings searched and secured in locker # 36, no contraband found.  Skin assessment completed and no skin issues noted.  Q 15 minute checks initiated for safety.  We will continue to monitor the progress towards his goals.

## 2018-12-04 NOTE — Progress Notes (Addendum)
Covid Swab collected 12/04/18 at 1310. AC made aware as inpatient admission onto adult unit is pending at this time.

## 2018-12-04 NOTE — Progress Notes (Signed)
D:  Kolsen continues to be labile and disorganized.  He remains bizarre in his mannerisms (pacing with eyes closed, robotic walking and walking into door at end of the hall before turning to go into his room).  Argumentative and demanding to see the doctor so he can leave.  He continually threatening to sue the hospital. He was agreeable to take hs medications with prn vistaril.  They were ineffective and continued to pace the floors with one arm over his head with angry look on his face.  He complained that the medications didn't help him sleep but was refusing more medications.  He agreed later to more prns for agitation and anxiety.  He was finally able to lay down in his bed.  He is currently resting with his eyes closed and appears to be asleep.  A:  1:1 with RN for support and encouragement.  Medications as ordered.  Q 15 minute checks maintained for safety.   R:  Kenniel remains safe on the unit.  We will continue to monitor the progress towards his goals.

## 2018-12-04 NOTE — Discharge Summary (Addendum)
  Patient transferred for Observation to [facility] inpatient for psychiatric treatment  Shuvon B. Rankin, NP   Patient's chart reviewed. Reviewed the information documented and agree with the treatment plan.  Buford Dresser, DO 12/04/18 6:54 PM

## 2018-12-04 NOTE — BHH Counselor (Signed)
Spoke with patient's mother, Joelene Millin. She reports "He spoke very clear and alert when I spoke with him. He spoke even clearer today." States she would like him to be discharged. This clinician reminded her that it was up to the psychiatrist ad nurse practitioner to determine if patient was stable enough for discharge.

## 2018-12-04 NOTE — Progress Notes (Signed)
   12/03/18 2136  COVID-19 Daily Checkoff  Have you had a fever (temp > 37.80C/100F)  in the past 24 hours?  No  If you have had runny nose, nasal congestion, sneezing in the past 24 hours, has it worsened? No  COVID-19 EXPOSURE  Have you traveled outside the state in the past 14 days? No  Have you been in contact with someone with a confirmed diagnosis of COVID-19 or PUI in the past 14 days without wearing appropriate PPE? No  Have you been living in the same home as a person with confirmed diagnosis of COVID-19 or a PUI (household contact)? No  Have you been diagnosed with COVID-19? No

## 2018-12-04 NOTE — Progress Notes (Signed)
[place] NOVEL CORONAVIRUS (COVID-19) DAILY CHECK-OFF SYMPTOMS - answer yes or no to each - every day NO YES  Have you had a fever in the past 24 hours?  . Fever (Temp > 37.80C / 100F) X   Have you had any of these symptoms in the past 24 hours? . New Cough .  Sore Throat  .  Shortness of Breath .  Difficulty Breathing .  Unexplained Body Aches   X   Have you had any one of these symptoms in the past 24 hours not related to allergies?   . Runny Nose .  Nasal Congestion .  Sneezing   X   If you have had runny nose, nasal congestion, sneezing in the past 24 hours, has it worsened?  X   EXPOSURES - check yes or no X   Have you traveled outside the state in the past 14 days?  X   Have you been in contact with someone with a confirmed diagnosis of COVID-19 or PUI in the past 14 days without wearing appropriate PPE?  X   Have you been living in the same home as a person with confirmed diagnosis of COVID-19 or a PUI (household contact)?    X   Have you been diagnosed with COVID-19?    X              What to do next: Answered NO to all: Answered YES to anything:   Proceed with unit schedule Follow the [facility] Inpatient Flowsheet.   

## 2018-12-04 NOTE — Progress Notes (Signed)
D: Patient is observed throughout the day walking through the hallway. Gait is slow but steady. Patient tends to close his eyes during ambulation at times, though reopens his eyes when asked. Patient is ambulatory and smiling. Patient has spoken to his Mother on the phone several times throughout the day. Patient has eaten at all scheduled meal times and frequently requests for his water pitcher to be refilled with warm water. Patient also asked for a heat pack for complaints of L sided back pain with relief. PRN vistaril given this afternoon. At present, patient denies any SI/HI/AVH.   A: Support and encouragement provided. Routine safety checks conducted every 15 minutes per unit protocol. Encouraged to notify if thoughts of harm toward self or others arise. Patient agrees.   R: Patient remains safe at this time. Will continue to monitor.

## 2018-12-04 NOTE — Progress Notes (Signed)
Pt accepted to [facility], 507[facility]1  Shuvon Rankin, NP is the accepting provider.  Johnn Hai, MD is the attending provider.  Call report to [phone]  @ [facility] notified.   Pt is IVC  Pt scheduled  to arrive at [place].  Areatha Keas. Judi Cong, MSW, [place] Disposition Clinical Social Work [phone] (cell) [phone] (office) .

## 2018-12-05 DIAGNOSIS — F209 Schizophrenia, unspecified: Secondary | ICD-10-CM | POA: Diagnosis present

## 2018-12-05 DIAGNOSIS — G4700 Insomnia, unspecified: Secondary | ICD-10-CM | POA: Diagnosis present

## 2018-12-05 DIAGNOSIS — F4325 Adjustment disorder with mixed disturbance of emotions and conduct: Secondary | ICD-10-CM | POA: Diagnosis present

## 2018-12-05 DIAGNOSIS — F6381 Intermittent explosive disorder: Secondary | ICD-10-CM | POA: Diagnosis present

## 2018-12-05 DIAGNOSIS — F23 Brief psychotic disorder: Secondary | ICD-10-CM | POA: Diagnosis not present

## 2018-12-05 DIAGNOSIS — Z1159 Encounter for screening for other viral diseases: Secondary | ICD-10-CM | POA: Diagnosis not present

## 2018-12-05 MED ORDER — PRENATAL MULTIVITAMIN CH
1.0000 | ORAL_TABLET | Freq: Every day | ORAL | Status: DC
  Administered 2018-12-05 – 2018-12-07 (×3): 1 via ORAL
  Filled 2018-12-05 (×4): qty 1

## 2018-12-05 MED ORDER — LORAZEPAM 2 MG/ML IJ SOLN: 4.0000 mg | Freq: Four times a day (QID) | INTRAMUSCULAR | Status: DC | PRN

## 2018-12-05 MED ORDER — RISPERIDONE 3 MG PO TABS
3.0000 mg | ORAL_TABLET | Freq: Two times a day (BID) | ORAL | Status: DC
  Administered 2018-12-05 – 2018-12-06 (×3): 3 mg via ORAL
  Filled 2018-12-05 (×6): qty 1

## 2018-12-05 MED ORDER — BENZTROPINE MESYLATE 1 MG PO TABS
1.0000 mg | ORAL_TABLET | Freq: Two times a day (BID) | ORAL | Status: DC
  Administered 2018-12-05 – 2018-12-07 (×4): 1 mg via ORAL
  Filled 2018-12-05 (×6): qty 1

## 2018-12-05 MED ORDER — OMEGA-3-ACID ETHYL ESTERS 1 G PO CAPS
1.0000 g | ORAL_CAPSULE | Freq: Two times a day (BID) | ORAL | Status: DC
  Administered 2018-12-05 – 2018-12-07 (×4): 1 g via ORAL
  Filled 2018-12-05 (×6): qty 1

## 2018-12-05 MED ORDER — TEMAZEPAM 30 MG PO CAPS
30.0000 mg | ORAL_CAPSULE | Freq: Every day | ORAL | Status: DC
  Administered 2018-12-05 – 2018-12-06 (×2): 30 mg via ORAL
  Filled 2018-12-05 (×2): qty 2

## 2018-12-05 NOTE — Plan of Care (Signed)
Progress note  Pt found in bed; compliant with medication administration. Pt ambulates with his eyes halfway closed. Pt seems to do this on purpose, as he will change his behavior at will. Pt denies any physical pain or symptoms. Pt states he has never been suicidal or had avh. Pt denies si/hi/ah/vh and verbally agrees to approach staff if these become apparent or before harming himself/others while at [facility]. Pt provided support and encouragement. Pt given medication per protocol and standing orders. Q73m safety checks implemented and continued. Will continue to monitor.   Pt progressing in the following metrics  Problem: Education: Goal: Knowledge of [place] [facility] Education information/materials will improve Outcome: Progressing Goal: Emotional status will improve Outcome: Progressing Goal: Mental status will improve Outcome: Progressing Goal: Verbalization of understanding the information provided will improve Outcome: Progressing

## 2018-12-05 NOTE — Progress Notes (Signed)
Recreation Therapy Notes  Date: 7.1.20 Time: 1015 Location: 500 Hall Dayroom  Group Topic: Communication, Team Building, Problem Solving  Goal Area(s) Addresses:  Patient will effectively work with peer towards shared goal.  Patient will identify skill used to make activity successful.  Patient will identify how skills used during activity can be used to reach post d/c goals.   Intervention: STEM Activity   Activity:  [employer].  Patients were in groups of three.  Patients were given 12 pipe cleaners.  Patients were to build a free standing tower as tall as possible.  During the course of the activity, there will be budget cuts and certain skills will be prohibited from use.   Education: Education officer, community, Dentist.   Education Outcome: Acknowledges education/In group clarification offered/Needs additional education.   Clinical Observations/Feedback:  Pt did not attend group.    Victorino Sparrow, LRT/CTRS         Victorino Sparrow A 12/05/2018 11:34 AM

## 2018-12-05 NOTE — Progress Notes (Signed)
Per MHT, Pt observe pacing in room naked. Pt was told to put clothes on per courtesy of staff doing Q 15. Pt appears increasingly bizarre and irritable. Pt states he cannot sleep. Pt observe sitting on the floor in the hallway. For this reason, PRN ativan, vistaril, and benadryl was given. Will follow up.
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
## 2018-12-05 NOTE — [facility] Suicide Risk Assessment (Signed)
[facility] Admission Suicide Risk Assessment   Nursing information obtained from:  Patient Demographic factors:  Male Current Mental Status:  NA Loss Factors:  Decline in physical health Historical Factors:  Impulsivity Risk Reduction Factors:  Living with another person, especially a relative, Sense of responsibility to family, Positive social support, Positive therapeutic relationship  Total Time spent with patient: 45 minutes Principal Problem: Acute psychosis ([place]) Diagnosis:  Principal Problem:   Acute psychosis ([place]) Active Problems:   Adjustment disorder with mixed disturbance of emotions and conduct   Intermittent explosive disorder  Subjective Data: Admitted for new onset behavioral changes psychosis threats towards others and hyper religiosity Continued Clinical Symptoms:  Alcohol Use Disorder Identification Test Final Score (AUDIT): 0 The "Alcohol Use Disorders Identification Test", Guidelines for Use in Primary Care, Second Edition.  World Pharmacologist [facility]). Score between 0-7:  no or low risk or alcohol related problems. Score between 8-15:  moderate risk of alcohol related problems. Score between 16-19:  high risk of alcohol related problems. Score 20 or above:  warrants further diagnostic evaluation for alcohol dependence and treatment.   CLINICAL FACTORS:   Schizophrenia:   Less than 33 years old   Musculoskeletal: Strength & Muscle Tone: within normal limits Gait & Station: normal Patient leans: N/A  Psychiatric Specialty Exam: Physical Exam only stiff in appearance but no EPS blood pressure slightly up 121/91  ROS neurological negative for head trauma or seizures/negative for substance abuse/cardiac negative for issues, GI/GU negative for issues on general screening.  Blood pressure (!) 121/91, pulse 78, temperature 98.5 F (36.9 C), temperature source Oral, resp. rate 18, height 6' (1.829 m), weight 79.4 kg, SpO2 99 %.Body mass index is 23.73 kg/m.   General Appearance: Casual  Eye Contact:  Good  Speech:  Clear and Coherent  Volume:  Monotone  Mood:  Euthymic  Affect:  Blunt  Thought Process:  Irrelevant and Descriptions of Associations: Loose  Orientation:  Other:  Person place general situation and year  Thought Content:  Illogical, Delusions and Paranoid Ideation  Suicidal Thoughts:  No  Homicidal Thoughts:  Yes.  without intent/plan had made homicidal threats prior to admission denies now states he "loves everybody" speaks in hyper religious terms  Memory:  Immediate;   Fair  Judgement:  Impaired  Insight:  Shallow  Psychomotor Activity:  Decreased  Concentration:  Concentration: Fair  Recall:  [place] of Knowledge:  Fair  Language: Monotone but normal volume  Akathisia:  Negative  Handed:  Right  AIMS (if indicated):     Assets:  Physical Health Resilience Social Support Talents/Skills  ADL's:  Intact  Cognition:  WNL  Sleep:  Number of Hours: 5.75         COGNITIVE FEATURES THAT CONTRIBUTE TO RISK:  Loss of executive function    SUICIDE RISK:   Mild:  HI/no si - of limited frequency, intensity, duration, and specificity.  There are no identifiable plans, no associated intent, mild dysphoria and related symptoms, good self-control (both objective and subjective assessment), few other risk factors, and identifiable protective factors, including available and accessible social support.  PLAN OF CARE: see eval  I certify that inpatient services furnished can reasonably be expected to improve the patient's condition.   Johnn Hai, MD 12/05/2018, 11:14 AM

## 2018-12-05 NOTE — Progress Notes (Signed)
Recreation Therapy Notes  INPATIENT RECREATION THERAPY ASSESSMENT  Patient Details Name: Bruce James MRN: [id number] DOB: 2000/09/01 Today's Date: 12/05/2018       Information Obtained From: Patient  Able to Participate in Assessment/Interview: Yes  Patient Presentation: (Drowsy)  Reason for Admission (Per Patient): Other (Comments)(Temper)  Patient Stressors: Other (Comment)(Wants everyone to like him)  Coping Skills:   Journal, TV, Arguments, Music, Exercise, Meditate, Deep Breathing, Talk, Art, Prayer, Avoidance, Read  Leisure Interests (2+):  Individual - Reading, Sports - Basketball, Exercise - Lifting Weights, Community - Other (Comment)(Karate)  Frequency of Recreation/Participation: Other (Comment)(Daily)  Awareness of Community Resources:  Yes  Community Resources:  Park  Current Use: Yes  If no, Barriers?:    Expressed Interest in [place]: No  County of Residence:  [place]  Patient Main Form of Transportation: Bicycle  Patient Strengths:  Love for people; Determination  Patient Identified Areas of Improvement:  Anger  Patient Goal for Hospitalization:  "become a better version of me"  Current SI (including self-harm):  No  Current HI:  No  Current AVH: No  Staff Intervention Plan: Group Attendance, Collaborate with Interdisciplinary Treatment Team  Consent to Intern Participation: N/A   Victorino Sparrow, LRT/CTRS  Victorino Sparrow A 12/05/2018, 12:32 PM

## 2018-12-05 NOTE — Progress Notes (Signed)
Progress note  Pt presented to the nurses station at shift change. "I just want to sleep". Pt appeared drowsy and unsteady. Pt provided medication and assisted to bed. Pt has been resting since. Pt denied any physical pain, only complaining of insomnia. Pt safe on the unit. q61m safety checks implemented and continued. Pt provided support and encouragement. Pt given medication per protocol and standing orders. Will continue to monitor.

## 2018-12-05 NOTE — Tx Team (Signed)
Interdisciplinary Treatment and Diagnostic Plan Update  12/05/2018 Time of Session: 09:25am Bruce James MRN: [id number]  Principal Diagnosis: Acute psychosis [name])  Secondary Diagnoses: Principal Problem:   Acute psychosis ([name]) Active Problems:   Adjustment disorder with mixed disturbance of emotions and conduct   Intermittent explosive disorder   Current Medications:  Current Facility-Administered Medications  Medication Dose Route Frequency Provider Last Rate Last Dose  . acetaminophen (TYLENOL) tablet 650 mg  650 mg Oral Q6H PRN Money, Lowry Ram, FNP      . alum & mag hydroxide-simeth (MAALOX/MYLANTA) 200-200-20 MG/5ML suspension 30 mL  30 mL Oral Q4H PRN Money, Darnelle Maffucci B, FNP      . diphenhydrAMINE (BENADRYL) capsule 50 mg  50 mg Oral Q6H PRN Money, Lowry Ram, FNP   50 mg at 12/05/18 0116   Or  . diphenhydrAMINE (BENADRYL) injection 50 mg  50 mg Intramuscular Q6H PRN Money, Darnelle Maffucci B, FNP   50 mg at 12/01/18 2032  . haloperidol (HALDOL) tablet 5 mg  5 mg Oral Q6H PRN Money, Lowry Ram, FNP   5 mg at 12/04/18 0107   Or  . haloperidol lactate (HALDOL) injection 10 mg  10 mg Intramuscular Q6H PRN Money, Lowry Ram, FNP   10 mg at 12/01/18 2032  . haloperidol (HALDOL) tablet 5 mg  5 mg Oral QHS Buford Dresser J, DO   5 mg at 12/04/18 2200  . hydrOXYzine (ATARAX/VISTARIL) tablet 50 mg  50 mg Oral TID PRN Money, Lowry Ram, FNP   50 mg at 12/05/18 0920  . LORazepam (ATIVAN) tablet 2 mg  2 mg Oral Q6H PRN Money, Lowry Ram, FNP   2 mg at 12/05/18 0920   Or  . LORazepam (ATIVAN) injection 2 mg  2 mg Intramuscular Q6H PRN Money, Lowry Ram, FNP   2 mg at 12/01/18 2032  . magnesium hydroxide (MILK OF MAGNESIA) suspension 30 mL  30 mL Oral Daily PRN Money, Lowry Ram, FNP      . traZODone (DESYREL) tablet 50 mg  50 mg Oral QHS [name], [name], MD   50 mg at 12/04/18 2201   PTA Medications: Medications Prior to Admission  Medication Sig Dispense Refill Last Dose  . ibuprofen (ADVIL,MOTRIN) 600  MG tablet Take 1 tablet (600 mg total) by mouth every 6 (six) hours as needed. (Patient not taking: Reported on 12/02/2018) 20 tablet 0 Not Taking at Unknown time  . naproxen (NAPROSYN) 375 MG tablet Take 1 tablet (375 mg total) by mouth 2 (two) times daily. Prn leg pain (Patient not taking: Reported on 12/02/2018) 20 tablet 0 Not Taking at Unknown time    Patient Stressors: Marital or family conflict Substance abuse  Patient Strengths: General fund of knowledge Physical Health Supportive family/friends  Treatment Modalities: Medication Management, Group therapy, Case management,  1 to 1 session with clinician, Psychoeducation, Recreational therapy.   Physician Treatment Plan for Primary Diagnosis: Acute psychosis ([name]) Long Term Goal(s):     Short Term Goals:    Medication Management: Evaluate patient's response, side effects, and tolerance of medication regimen.  Therapeutic Interventions: 1 to 1 sessions, Unit Group sessions and Medication administration.  Evaluation of Outcomes: Progressing  Physician Treatment Plan for Secondary Diagnosis: Principal Problem:   Acute psychosis ([name]) Active Problems:   Adjustment disorder with mixed disturbance of emotions and conduct   Intermittent explosive disorder  Long Term Goal(s):     Short Term Goals:       Medication Management: Evaluate patient's response, side  effects, and tolerance of medication regimen.  Therapeutic Interventions: 1 to 1 sessions, Unit Group sessions and Medication administration.  Evaluation of Outcomes: Progressing   RN Treatment Plan for Primary Diagnosis: Acute psychosis ([name]) Long Term Goal(s): Knowledge of disease and therapeutic regimen to maintain health will improve  Short Term Goals: Ability to participate in decision making will improve, Ability to verbalize feelings will improve, Ability to disclose and discuss suicidal ideas, Ability to identify and develop effective coping behaviors will  improve and Compliance with prescribed medications will improve  Medication Management: RN will administer medications as ordered by provider, will assess and evaluate patient's response and provide education to patient for prescribed medication. RN will report any adverse and/or side effects to prescribing provider.  Therapeutic Interventions: 1 on 1 counseling sessions, Psychoeducation, Medication administration, Evaluate responses to treatment, Monitor vital signs and CBGs as ordered, Perform/monitor CIWA, COWS, AIMS and Fall Risk screenings as ordered, Perform wound care treatments as ordered.  Evaluation of Outcomes: Progressing   LCSW Treatment Plan for Primary Diagnosis: Acute psychosis ([name]) Long Term Goal(s): Safe transition to appropriate next level of care at discharge, Engage patient in therapeutic group addressing interpersonal concerns.  Short Term Goals: Engage patient in aftercare planning with referrals and resources and Increase skills for wellness and recovery  Therapeutic Interventions: Assess for all discharge needs, 1 to 1 time with Social worker, Explore available resources and support systems, Assess for adequacy in community support network, Educate family and significant other(s) on suicide prevention, Complete Psychosocial Assessment, Interpersonal group therapy.  Evaluation of Outcomes: Progressing   Progress in Treatment: Attending groups: No. Participating in groups: No. Taking medication as prescribed: Yes. Toleration medication: Yes. Family/Significant other contact made: No, will contact:  will contact if given consent to contact Patient understands diagnosis: No. Discussing patient identified problems/goals with staff: Yes. Medical problems stabilized or resolved: Yes. Denies suicidal/homicidal ideation: Yes. Issues/concerns per patient self-inventory: No. Other:   New problem(s) identified: No, Describe:  None  New Short Term/Long Term Goal(s):  Medication stabilization, elimination of SI thoughts, and development of a comprehensive mental wellness plan.   Patient Goals:  "Defeat the spirit of hate and racism in the world"  Discharge Plan or Barriers: CSW will continue to follow up for appropriate referrals and possible discharge planning  Reason for Continuation of Hospitalization: Aggression Hallucinations Medication stabilization  Estimated Length of Stay: 3-5 days  Attendees: Patient: 12/05/2018   Physician: Dr. Malvin JohnsBrian Farah, MD 12/05/2018  Nursing: Casimiro NeedleMichael, RN 12/05/2018  RN Care Manager: 12/05/2018   Social Worker: Stephannie PetersJasmine Cortez Steelman, LCSW 12/05/2018   Recreational Therapist:  12/05/2018  Other:  12/05/2018  Other:  12/05/2018   Other: 12/05/2018       Scribe for Treatment Team: Delphia GratesJasmine M Ramses Klecka, LCSW 12/05/2018 10:09 AM

## 2018-12-05 NOTE — Progress Notes (Signed)
Pt brought to the adult unit from the observation unit. All paperwork completed in the observation unit.

## 2018-12-06 MED ORDER — VITAMIN B-12 1000 MCG PO TABS
1000.0000 ug | ORAL_TABLET | Freq: Every day | ORAL | Status: DC
  Administered 2018-12-06 – 2018-12-07 (×2): 1000 ug via ORAL
  Filled 2018-12-06 (×4): qty 1

## 2018-12-06 NOTE — Progress Notes (Signed)
Nursing Progress Note: 7p-7a D: Pt currently presents with a pleasant/flat affect and behavior. Interacting minimally with the milieu. Pt reports good sleep during the previous night with current medication regimen.  A: Pt provided with medications per providers orders. Pt's labs and vitals were monitored throughout the night. Pt supported emotionally and encouraged to express concerns and questions. Pt educated on medications.  R: Pt's safety ensured with 15 minute and environmental checks. Pt currently denies SI, HI, and AVH. Pt verbally contracts to seek staff if SI,HI, or AVH occurs and to consult with staff before acting on any harmful thoughts. Will continue to monitor.   [place] NOVEL CORONAVIRUS (COVID-19) DAILY CHECK-OFF SYMPTOMS - answer yes or no to each - every day NO YES  Have you had a fever in the past 24 hours?  . Fever (Temp > 37.80C / 100F) X   Have you had any of these symptoms in the past 24 hours? . New Cough .  Sore Throat  .  Shortness of Breath .  Difficulty Breathing .  Unexplained Body Aches   X   Have you had any one of these symptoms in the past 24 hours not related to allergies?   . Runny Nose .  Nasal Congestion .  Sneezing   X   If you have had runny nose, nasal congestion, sneezing in the past 24 hours, has it worsened?  X   EXPOSURES - check yes or no X   Have you traveled outside the state in the past 14 days?  X   Have you been in contact with someone with a confirmed diagnosis of COVID-19 or PUI in the past 14 days without wearing appropriate PPE?  X   Have you been living in the same home as a person with confirmed diagnosis of COVID-19 or a PUI (household contact)?    X   Have you been diagnosed with COVID-19?    X              What to do next: Answered NO to all: Answered YES to anything:   Proceed with unit schedule Follow the [facility] Inpatient Flowsheet.

## 2018-12-06 NOTE — [facility] Group Notes (Signed)
[place] LCSW Group Therapy Note  Date/Time: 12/06/2018 @ 1:00pm  Type of Therapy/Topic:  Group Therapy:  Feelings about Diagnosis  Participation Level:  Minimal   Mood: Pleasant   Description of Group:    This group will allow patients to explore their thoughts and feelings about diagnoses they have received. Patients will be guided to explore their level of understanding and acceptance of these diagnoses. Facilitator will encourage patients to process their thoughts and feelings about the reactions of others to their diagnosis, and will guide patients in identifying ways to discuss their diagnosis with significant others in their lives. This group will be process-oriented, with patients participating in exploration of their own experiences as well as giving and receiving support and challenge from other group members.   Therapeutic Goals: 1. Patient will demonstrate understanding of diagnosis as evidence by identifying two or more symptoms of the disorder:  2. Patient will be able to express two feelings regarding the diagnosis 3. Patient will demonstrate ability to communicate their needs through discussion and/or role plays  Summary of Patient Progress:   Pt attended group therapy. Pt did not engage much during group therapy. Pt slept most of the group but did state that having a mental health and physical health diagnosis is a natural balance. Pt was able to identify different stigmas associated with a mental health diagnosis which is: being a psycho/threat.     Therapeutic Modalities:   Cognitive Behavioral Therapy Brief Therapy Feelings Identification   Ardelle Anton, LCSW

## 2018-12-06 NOTE — Progress Notes (Signed)
[facility] MD Progress Note  12/06/2018 11:21 AM Bruce James  MRN:  [id number] Subjective:    Patient very focused on discharge calling his hospitalization "enslavement" stating his anger is under control and he needs to go.  Spoke with his mother with his permission.  She elaborated that his aunt had a history of alcoholism and was diagnosed with substance-induced mood disorder bipolar type that is they thought she had bipolar when she was under the influence or coming off of alcohol but nothing else runs in the family patient had previously been quite high functioning. Discussed neuroprotective measures in addition to acute treatment Patient's mother also elaborated that about 2 weeks ago patient had blocked friends on Facebook made some aggressive statements and threats even to even a gang member and then backed off and apologize for his behaviors and that is what he refers to as anger management issues Principal Problem: Acute psychosis ([place]) Diagnosis: Principal Problem:   Acute psychosis ([place]) Active Problems:   Adjustment disorder with mixed disturbance of emotions and conduct   Intermittent explosive disorder  Total Time spent with patient: 20 minutes  Past Psychiatric History: neg  Past Medical History: History reviewed. No pertinent past medical history. History reviewed. No pertinent surgical history. Family History: History reviewed. No pertinent family history. Family Psychiatric  History: neg Social History: nl high functioning Social History   Substance and Sexual Activity  Alcohol Use No     Social History   Substance and Sexual Activity  Drug Use No    Social History   Socioeconomic History  . Marital status: Single    Spouse name: Not on file  . Number of children: Not on file  . Years of education: Not on file  . Highest education level: Not on file  Occupational History  . Not on file  Social Needs  . Financial resource strain: Not on file  . Food  insecurity    Worry: Not on file    Inability: Not on file  . Transportation needs    Medical: Not on file    Non-medical: Not on file  Tobacco Use  . Smoking status: Never Smoker  . Smokeless tobacco: Never Used  Substance and Sexual Activity  . Alcohol use: No  . Drug use: No  . Sexual activity: Not on file  Lifestyle  . Physical activity    Days per week: Not on file    Minutes per session: Not on file  . Stress: Not on file  Relationships  . Social Herbalist on phone: Not on file    Gets together: Not on file    Attends religious service: Not on file    Active member of club or organization: Not on file    Attends meetings of clubs or organizations: Not on file    Relationship status: Not on file  Other Topics Concern  . Not on file  Social History Narrative  . Not on file   Additional Social History:    Pain Medications: See MAR Prescriptions: See MAR Over the Counter: See MAR History of alcohol / drug use?: No history of alcohol / drug abuse                    Sleep: Good  Appetite:  Good  Current Medications: Current Facility-Administered Medications  Medication Dose Route Frequency Provider Last Rate Last Dose  . acetaminophen (TYLENOL) tablet 650 mg  650 mg Oral Q6H PRN Money,  Gerlene Burdockravis B, FNP      . alum & mag hydroxide-simeth (MAALOX/MYLANTA) 200-200-20 MG/5ML suspension 30 mL  30 mL Oral Q4H PRN Money, Feliz Beamravis B, FNP      . benztropine (COGENTIN) tablet 1 mg  1 mg Oral BID Malvin JohnsFarah, Walsie Smeltz, MD   1 mg at 12/06/18 0743  . diphenhydrAMINE (BENADRYL) capsule 50 mg  50 mg Oral Q6H PRN Money, Gerlene Burdockravis B, FNP   50 mg at 12/05/18 1924   Or  . diphenhydrAMINE (BENADRYL) injection 50 mg  50 mg Intramuscular Q6H PRN Money, Feliz Beamravis B, FNP   50 mg at 12/01/18 2032  . haloperidol (HALDOL) tablet 5 mg  5 mg Oral Q6H PRN Money, Gerlene Burdockravis B, FNP   5 mg at 12/05/18 1646   Or  . haloperidol lactate (HALDOL) injection 10 mg  10 mg Intramuscular Q6H PRN Money, Gerlene Burdockravis  B, FNP   10 mg at 12/01/18 2032  . haloperidol (HALDOL) tablet 5 mg  5 mg Oral QHS Juanetta BeetsNorman, Jacqueline J, DO   5 mg at 12/05/18 2155  . hydrOXYzine (ATARAX/VISTARIL) tablet 50 mg  50 mg Oral TID PRN Money, Gerlene Burdockravis B, FNP   50 mg at 12/05/18 0920  . LORazepam (ATIVAN) tablet 2 mg  2 mg Oral Q6H PRN Money, Gerlene Burdockravis B, FNP   2 mg at 12/05/18 1924   Or  . LORazepam (ATIVAN) injection 2 mg  2 mg Intramuscular Q6H PRN Money, Gerlene Burdockravis B, FNP   2 mg at 12/01/18 2032  . LORazepam (ATIVAN) injection 4 mg  4 mg Intramuscular Q6H PRN Malvin JohnsFarah, Jamie-Lee Galdamez, MD      . magnesium hydroxide (MILK OF MAGNESIA) suspension 30 mL  30 mL Oral Daily PRN Money, Gerlene Burdockravis B, FNP      . omega-3 acid ethyl esters (LOVAZA) capsule 1 g  1 g Oral BID Malvin JohnsFarah, Yamilette Garretson, MD   1 g at 12/06/18 0743  . prenatal multivitamin tablet 1 tablet  1 tablet Oral Daily Malvin JohnsFarah, Ayme Short, MD   1 tablet at 12/06/18 [phone]  . risperiDONE (RISPERDAL) tablet 3 mg  3 mg Oral BID Malvin JohnsFarah, Zaeden Lastinger, MD   3 mg at 12/06/18 0743  . temazepam (RESTORIL) capsule 30 mg  30 mg Oral QHS Malvin JohnsFarah, Zeniya Lapidus, MD   30 mg at 12/05/18 2155  . traZODone (DESYREL) tablet 50 mg  50 mg Oral QHS [name], [name], MD   50 mg at 12/05/18 2155    Lab Results: No results found for this or any previous visit (from the past 48 hour(s)).  Blood Alcohol level:  No results found for: [facility]  Metabolic Disorder Labs: Lab Results  Component Value Date   HGBA1C 5.1 12/02/2018   MPG 99.67 12/02/2018   No results found for: PROLACTIN No results found for: CHOL, TRIG, HDL, CHOLHDL, VLDL, LDLCALC  Physical Findings: AIMS: Facial and Oral Movements Muscles of Facial Expression: None, normal Lips and Perioral Area: None, normal Jaw: None, normal Tongue: None, normal,Extremity Movements Upper (arms, wrists, hands, fingers): None, normal Lower (legs, knees, ankles, toes): None, normal, Trunk Movements Neck, shoulders, hips: None, normal, Overall Severity Severity of abnormal movements (highest score from  questions above): None, normal Incapacitation due to abnormal movements: None, normal Patient's awareness of abnormal movements (rate only patient's report): No Awareness, Dental Status Current problems with teeth and/or dentures?: No Does patient usually wear dentures?: No  CIWA:  CIWA-Ar Total: 8 COWS:  COWS Total Score: 7  Musculoskeletal: Strength & Muscle Tone: within normal limits Gait & Station: normal  Patient leans: N/A  Psychiatric Specialty Exam: Physical Exam  ROS  Blood pressure (!) 94/48, pulse 81, temperature 98.5 F (36.9 C), temperature source Oral, resp. rate 16, height 6' (1.829 m), weight 79.4 kg, SpO2 99 %.Body mass index is 23.73 kg/m.  General Appearance: Casual  Eye Contact:  Good  Speech:  Clear and Coherent  Volume:  Decreased  Mood:  Irritable  Affect:  Congruent  Thought Process:  Goal Directed and Descriptions of Associations: Tangential  Orientation:  Full (Time, Place, and Person)  Thought Content:  Illogical and Delusions  Suicidal Thoughts:  No  Homicidal Thoughts:  No  Memory:  Immediate;   Fair  Judgement:  Impaired  Insight:  Shallow  Psychomotor Activity:  Normal  Concentration:  Concentration: Fair  Recall:  FiservFair  Fund of Knowledge:  Fair  Language:  Fair  Akathisia:  Negative  Handed:  Right  AIMS (if indicated):     Assets:  Physical Health Resilience  ADL's:  Intact  Cognition:  WNL  Sleep:  Number of Hours: 6.25    Treatment Plan Summary: Daily contact with patient to assess and evaluate symptoms and progress in treatment and Medication management Probable discharge tomorrow.  Discussed antioxidants omega-3's and B vitamins.  Malvin JohnsFARAH,Waynette Towers, MD 12/06/2018, 11:21 AM

## 2018-12-06 NOTE — Plan of Care (Signed)
Progress note  Pt found in the hallway; compliant with medication administration. Pt had to be instructed to dress appropriately as he was disrobing. Pt compliant. Pt still feels like he is "slavery". Pt hasn't made these statements after going outside. Pt denies si/hi/ah/vh and verbally agrees to approach staff if these become apparent or before harming himself/others while at [place]: Pt provided support and encouragement. Pt given medication per protocol and standing orders. Q33m safety checks implemented and continued.  R: Pt safe on the unit. Will continue to monitor.   Pt progressing in the following metrics  Problem: Activity: Goal: Interest or engagement in activities will improve Outcome: Progressing Goal: Sleeping patterns will improve Outcome: Progressing   Problem: Coping: Goal: Ability to verbalize frustrations and anger appropriately will improve Outcome: Progressing Goal: Ability to demonstrate self-control will improve Outcome: Progressing

## 2018-12-06 NOTE — [facility] Suicide Risk Assessment (Signed)
[place] INPATIENT:  Family/Significant Other Suicide Prevention Education  Suicide Prevention Education:  Education Completed; Pt's mother, Bruce James, has been identified by the patient as the family member/significant other with whom the patient will be residing, and identified as the person(s) who will aid the patient in the event of a mental health crisis (suicidal ideations/suicide attempt).  With written consent from the patient, the family member/significant other has been provided the following suicide prevention education, prior to the and/or following the discharge of the patient.  The suicide prevention education provided includes the following:  Suicide risk factors  Suicide prevention and interventions  National Suicide Hotline telephone number  [facility] assessment telephone number  [facility] Emergency Assistance [place] and/or Residential Mobile Crisis Unit telephone number  Request made of family/significant other to:  Remove weapons (e.g., guns, rifles, knives), all items previously/currently identified as safety concern.    Remove drugs/medications (over-the-counter, prescriptions, illicit drugs), all items previously/currently identified as a safety concern.  The family member/significant other verbalizes understanding of the suicide prevention education information provided.  The family member/significant other agrees to remove the items of safety concern listed above.  CSW contacted pt's mother, Bruce James. Pt's mother stated that she wants the pt to be able to leave the hospital and come home with a plan. She states that she wants some help with him at home. Pt's mother stated that this is a first incident of these behaviors and she does not know of any resources. Pt's mother stated that he needs a doctor/psychiatrist and therapist for his mental health but he also needs a PCP. Pt's mother stated that he was going somewhere but  they could not take him because he turned 60. Pt's mother states that she wants someone who can work with his age (prefers a family physician). Pt's mother states that he has medicaid. Pt's mother states that she wants the pt to be able to come home over the weekend and has been trying to get ahold of the doctor and has not been successful. Pt's mother states that whatever he wants for aftercare that she will agree with she just wants him to get the help that he needs and come home.   Trecia Rogers 12/06/2018, 10:02 AM

## 2018-12-06 NOTE — Progress Notes (Signed)
Patient ID: Bruce James, male   DOB: 03-Aug-2000, 18 y.o.   MRN: [id number]   CSW met with patient to ask him about permission to talk to his mother. Pt gave permission/consent to speak with his mother.

## 2018-12-06 NOTE — BHH Counselor (Signed)
Adult Comprehensive Assessment  Patient ID: Bruce James, male   DOB: 08/25/2000, 18 y.o.   MRN: [id number]  Information Source: Information source: Patient  Current Stressors:  Patient states their primary concerns and needs for treatment are:: "I feel like it was my anger issues and sometimes I get too stressed out and I get anger" Patient states their goals for this hospitilization and ongoing recovery are:: "Make it the best and defeat the spirit of racism" Educational / Learning stressors: Pt is a sophomore at [employer]. 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 Family Relationships: Pt denies stressors Financial / Lack of resources (include bankruptcy): Pt reports that his dad is poor. Housing / Lack of housing: Pt denies stressors Physical health (include injuries & life threatening diseases): Pt denies stressors Social relationships: Pt denies stressors Substance abuse: Pt denies stressors Bereavement / Loss: Pt denies stressors  Living/Environment/Situation:  Living Arrangements: Children, Parent Living conditions (as described by patient or guardian): "I love living with my families" Who else lives in the home?: Mom and 8 younger siblings How long has patient lived in current situation?: "My whole life" What is atmosphere in current home: Comfortable, Supportive, Loving  Family History:  Marital status: Single Are you sexually active?: ("whatever you think") What is your sexual orientation?: Heterosexual Has your sexual activity been affected by drugs, alcohol, medication, or emotional stress?: No Does patient have children?: No  Childhood History:  By whom was/is the patient raised?: Mother, Mother/father and step-parent Additional childhood history information: "Dad was in the hood in [place]" Description of patient's relationship with caregiver when they were a child: "Good. Me and my mom got a good  life" Patient's description of current relationship with people who raised him/her: "Still good. She thinks I am going to be a little boy my whole life" How were you disciplined when you got in trouble as a child/adolescent?: Whoopings Does patient have siblings?: Yes Number of Siblings: 8 Description of patient's current relationship with siblings: "I love my siblings" Did patient suffer any verbal/emotional/physical/sexual abuse as a child?: No Did patient suffer from severe childhood neglect?: No Has patient ever been sexually abused/assaulted/raped as an adolescent or adult?: No Was the patient ever a victim of a crime or a disaster?: No Witnessed domestic violence?: No Has patient been effected by domestic violence as an adult?: No  Education:  Highest grade of school patient has completed: Some college Currently a student?: Yes Name of school: [employer] person: NA How long has the patient attended?: 2 years Learning disability?: No  Employment/Work Situation:   Employment situation: Employed Where is patient currently employed?: 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 How long has patient been employed?: 2 years Patient's job has been impacted by current illness: No What is the longest time patient has a held a job?: 2 years Where was the patient employed at that time?: With his mom Did You Receive Any Psychiatric Treatment/Services While in the [employer]?: No Are There Guns or Other Weapons in [place]?: No  Financial Resources:   Museum/gallery curator resources: Medicaid, Support from parents / caregiver Does patient have a Programmer, applications or guardian?: No  Alcohol/Substance Abuse:   What has been your use of drugs/alcohol within the last 12 months?: Pt denies substance use. If attempted suicide, did drugs/alcohol play a role in this?: No Alcohol/Substance Abuse Treatment Hx: Denies past history Has alcohol/substance  abuse ever caused legal problems?:  No  Social Support System:   Patient's Community Support System: Good Describe Community Support System: Mom, stepdad, and dad. Type of faith/religion: "Believe in God" How does patient's faith help to cope with current illness?: "I can feel God's spirit"  Leisure/Recreation:   Leisure and Hobbies: Read, write goals down, work out, play basketball, and train brothers to go pro  Strengths/Needs:   What is the patient's perception of their strengths?: Speaking/communication and good at sports. Patient states they can use these personal strengths during their treatment to contribute to their recovery: "I've already recovered" Patient states these barriers may affect/interfere with their treatment: N/A Patient states these barriers may affect their return to the community: N/A Other important information patient would like considered in planning for their treatment: N/A  Discharge Plan:   Currently receiving community mental health services: No Patient states concerns and preferences for aftercare planning are: [facility] for med management and therapy. [facility] for PCP doctor Patient states they will know when they are safe and ready for discharge when: "Ready to go now" Does patient have access to transportation?: Yes(mom) Does patient have financial barriers related to discharge medications?: No Will patient be returning to same living situation after discharge?: Yes(back with mom and siblings)  Summary/Recommendations:   Summary and Recommendations (to be completed by the evaluator): Pt is a 18 year old male who presents with a 2-week history of new onset psychosis, hyper religious statements and violent threats. Pt's diagnosis is: Acute psychosis ([name]). Recommendations for pt include: crisis stabilization, therapeutic milieu, medication management, attend and participate in group therapy, and development of a comprehensive mental wellness plan.  Delphia GratesJasmine M Michala Deblanc.  12/06/2018

## 2018-12-06 NOTE — Progress Notes (Signed)
Recreation Therapy Notes  Date: 7.2.20 Time: 1000 Location: 500 Hall Dayroom  Group Topic: Anger Management  Goal Area(s) Addresses:  Patient will identify triggers for anger.  Patient will identify a situation that makes them angry.  Patient will identify what other emotions comes with anger.   Behavioral Response:  Engaged  Intervention: Worksheet  Activity:  Introduction to Anger Management.  Patients were to identify three situations that lead to anger, what they do when angry and what problems have they run into because of anger.  Education: Anger Management, Discharge Planning   Education Outcome: Acknowledges education/In group clarification offered/Needs additional education.   Clinical Observations/Feedback: Patient stated identified dumb questions and people calling him weird make him angry.  Patient expressed when he gets angry he thinks about only getting his point through and shouts.  Patient stated his anger has not cause him any problems.     Bruce James, LRT/CTRS         Ria Comment, Vann Okerlund A 12/06/2018 10:49 AM

## 2018-12-06 NOTE — BHH Group Notes (Signed)
Occupational Therapy Group Note  Date:  12/06/2018 Time:  1:37 PM  Group Topic/Focus:  Leisure Group  Participation Level:  Active  Participation Quality:  Redirectable  Affect:  Flat  Cognitive:  Disorganized  Insight: Lacking  Engagement in Group:  Developing/Improving  Modes of Intervention:  Activity, Discussion, Education and Socialization  Additional Comments:    S: "Listen to understand before you respond"  O: OT group focus on leisure this date, while incorporating coping skills to ensure understanding. Pts to play game of Uno: and name a struggle they're facing, a communication skill, something they like about yourself, stress management, and a healthy way to manage anger per certain cards played. Pt also assessed for attention, ability to follow rules, and temperament with rules.   A: Pt presents to flat affect, appearing disorganized throughout session. When redirected, his statements were more appropriate and pertaining to topic. At times during group he would stand up and pace or stand up and put both hands on the window until he was cued to join the activity again. Pt appears supportive of other group members, but at time crossing some personal boundaries by offering to "meet the people who hurt them".  P: OT group will be x1 per week while pt inpatient.  Zenovia Jarred, MSOT, OTR/L Behavioral Health OT/ Acute Relief OT PHP Office: [place] 12/06/2018, 1:37 PM

## 2018-12-07 MED ORDER — BENZTROPINE MESYLATE 1 MG PO TABS: 1.0000 mg | ORAL_TABLET | Freq: Two times a day (BID) | ORAL | 2 refills | Status: AC

## 2018-12-07 MED ORDER — OMEGA-3-ACID ETHYL ESTERS 1 G PO CAPS: 1.0000 g | ORAL_CAPSULE | Freq: Two times a day (BID) | ORAL | 2 refills | Status: AC

## 2018-12-07 MED ORDER — RISPERIDONE 3 MG PO TABS: ORAL_TABLET | ORAL | 1 refills | Status: AC

## 2018-12-07 MED ORDER — ENLYTE PO CAPS: ORAL_CAPSULE | ORAL | 11 refills | Status: AC

## 2018-12-07 MED ORDER — TEMAZEPAM 30 MG PO CAPS: 30.0000 mg | ORAL_CAPSULE | Freq: Every evening | ORAL | 0 refills | Status: AC | PRN

## 2018-12-07 NOTE — Plan of Care (Signed)
Pt was able to identify benefits of using appropriate anger management techniques.    Victorino Sparrow, LRT/CTRS

## 2018-12-07 NOTE — Progress Notes (Signed)
Recreation Therapy Notes  Date: 7.3.20 Time: 1000 Location: 500 Hall Dayroom  Group Topic: Goal Setting  Goal Area(s) Addresses:  Patient will be able to identify at least 3 life goals.  Patient will be able to identify benefit of investing in life goals.  Patient will be able to identify benefit of setting life goals.   Behavioral Response:  Engaged  Intervention:  Worksheet  Activity: Lobbyist.  Pt will identify goals they wish to accomplish in the next week, month, year and five years.  Pt will also identify obstacles to reaching goals, what they need to achieve goals and what they can start doing now to work towards goals.  Education:  Discharge Planning, Radiographer, therapeutic, Leisure Education   Education Outcome: Acknowledges Education/In Group Clarification Provided/Needs Additional Education  Clinical Observations:  Pt expressed in a week- be 100% again; month- make $1000 a day; year- be well known; and in five years- graduate from college.  Pt identified being himself at all times to work towards and accomplish his goals.    Victorino Sparrow, LRT/CTRS      Victorino Sparrow A 12/07/2018 10:54 AM

## 2018-12-07 NOTE — BHH Suicide Risk Assessment (Signed)
[facility] Discharge Suicide Risk Assessment   Principal Problem: Acute psychosis ([place]) Discharge Diagnoses: Principal Problem:   Acute psychosis ([place]) Active Problems:   Adjustment disorder with mixed disturbance of emotions and conduct   Intermittent explosive disorder   Total Time spent with patient: 45 minutes  Musculoskeletal: Strength & Muscle Tone: within normal limits Gait & Station: normal Patient leans: N/A  Psychiatric Specialty Exam: ROS  Blood pressure (!) 94/48, pulse 81, temperature 98.5 F (36.9 C), temperature source Oral, resp. rate 16, height 6' (1.829 m), weight 79.4 kg, SpO2 99 %.Body mass index is 23.73 kg/m.  General Appearance: Casual  Eye Contact::  Good  Speech:  Clear and Coherent409  Volume:  Normal  Mood:  Euthymic  Affect:  Congruent  Thought Process:  Coherent  Orientation:  Full (Time, Place, and Person)  Thought Content:  Logical  Suicidal Thoughts:  No  Homicidal Thoughts:  No  Memory:  Immediate;   Good  Judgement:  Good  Insight:  Good  Psychomotor Activity:  Normal  Concentration:  Good  Recall:  Good  Fund of Knowledge:Good  Language: Good  Akathisia:  Negative  Handed:  Right  AIMS (if indicated):     Assets:  Communication Skills Desire for Improvement Financial Resources/Insurance Housing Leisure Time Physical Health Resilience Social Support Talents/Skills Transportation  Sleep:  Number of Hours: 5.25  Cognition: WNL  ADL's:  Intact   Mental Status Per Nursing Assessment::   On Admission:  NA  Demographic Factors:  NA  Loss Factors: NA  Historical Factors: NA  Risk Reduction Factors:   Sense of responsibility to family and Religious beliefs about death  Continued Clinical Symptoms:  Schizophrenia:   Less than 67 years old  Cognitive Features That Contribute To Risk:  None    Suicide Risk:  Minimal: No identifiable suicidal ideation.  Patients presenting with no risk factors but with morbid ruminations;  may be classified as minimal risk based on the severity of the depressive symptoms  Follow-up [facility] Follow up.   Contact information: [place] [id number] [place], [facility] Follow up.   Specialty: [facility] information: [place] [address] [phone]           Plan Of Care/Follow-up recommendations:  Activity:  full  Dredyn Gubbels, MD 12/07/2018, 8:02 AM

## 2018-12-07 NOTE — Progress Notes (Signed)
Patient discharged to lobby. Patient was stable and appreciative at that time. All papers and prescriptions were given and valuables returned. Verbal understanding expressed. Denies SI/HI and A/VH. Patient given opportunity to express concerns and ask questions.  

## 2018-12-07 NOTE — Progress Notes (Signed)
Recreation Therapy Notes  INPATIENT RECREATION TR PLAN  Patient Details Name: Bruce James MRN: [id number] DOB: 01/08/2001 Today's Date: 12/07/2018  Rec Therapy Plan Is patient appropriate for Therapeutic Recreation?: Yes Treatment times per week: about 3 days Estimated Length of Stay: 5-7 days TR Treatment/Interventions: Group participation (Comment)  Discharge Criteria Pt will be discharged from therapy if:: Discharged Treatment plan/goals/alternatives discussed and agreed upon by:: Patient/family  Discharge Summary Short term goals set: See patient care plan Short term goals met: Complete Progress toward goals comments: Groups attended Which groups?: Anger management Reason goals not met: None Therapeutic equipment acquired: N/A Reason patient discharged from therapy: Discharge from hospital Pt/family agrees with progress & goals achieved: Yes Date patient discharged from therapy: 12/07/18     Victorino Sparrow, LRT/CTRS  Ria Comment, [place] A 12/07/2018, 10:03 AM

## 2018-12-07 NOTE — Discharge Summary (Signed)
Physician Discharge Summary Note  Patient:  Bruce James is an 18 y.o., male MRN:  [id number] DOB:  May 06, 2001 Patient phone:  [phone] (home)  Patient address:   [address] [place] [address],  Total Time spent with patient: 15 minutes  Date of Admission:  12/01/2018 Date of Discharge: 12/07/18  Reason for Admission:  New onset psychosis  Principal Problem: Acute psychosis [facility]([name]) Discharge Diagnoses: Principal Problem:   Acute psychosis ([name]) Active Problems:   Adjustment disorder with mixed disturbance of emotions and conduct   Intermittent explosive disorder   Past Psychiatric History: No prior psychiatric history  Past Medical History: History reviewed. No pertinent past medical history. History reviewed. No pertinent surgical history. Family History: History reviewed. No pertinent family history. Family Psychiatric  History: Aunt with substance use disorder Social History:  Social History   Substance and Sexual Activity  Alcohol Use No     Social History   Substance and Sexual Activity  Drug Use No    Social History   Socioeconomic History  . Marital status: Single    Spouse name: Not on file  . Number of children: Not on file  . Years of education: Not on file  . Highest education level: Not on file  Occupational History  . Not on file  Social Needs  . Financial resource strain: Not on file  . Food insecurity    Worry: Not on file    Inability: Not on file  . Transportation needs    Medical: Not on file    Non-medical: Not on file  Tobacco Use  . Smoking status: Never Smoker  . Smokeless tobacco: Never Used  Substance and Sexual Activity  . Alcohol use: No  . Drug use: No  . Sexual activity: Not on file  Lifestyle  . Physical activity    Days per week: Not on file    Minutes per session: Not on file  . Stress: Not on file  Relationships  . Social Musicianconnections    Talks on phone: Not on file    Gets together: Not on file     Attends religious service: Not on file    Active member of club or organization: Not on file    Attends meetings of clubs or organizations: Not on file    Relationship status: Not on file  Other Topics Concern  . Not on file  Social History Narrative  . Not on file    Hospital Course:  From admission assessment: Bruce CoupMaurice M Tague is an 18 y.o. male that presents this date with IVC. Per IVC: "[name] is hostile and aggressive. The respondent is threatening and states he is going to kill people and break their necks. The respondent cries for long periods of time because of "all the pain in world". [name] punches a concrete wall in his residence "over and over." [name] is a danger to himself and others." Patient presents with a very guarded affect and renders limited history. Patient is difficult to redirect and tangential. Patient is observed to be very agitated and speaks in a loud pressured voice. When asked if patient is oriented patient states "does it matter." Patient denies any S/I but reports ongoing H/I stating he "will kill anyone who is in his way." Patient will not elaborate on content of statement. As this Clinical research associatewriter attempts to gather history patient has a leaf in his pocket which he shows to this Clinical research associatewriter and asks "do you see the beauty of God." Patient  makes several religious statements that are unrelated to questions asked. Patient states "there is so much beauty in the world." As this Clinical research associatewriter attempts to gather information patient is observed to attempt to leave the triage area stating "I am done." Patient is observed to be very angry and clinches his fists stating "we are done here." Information to complete assessment was obtained from parent who is present (mother Olga MillersKimberly Carver [phone]) who renders collateral. Mother states patient is in his second year at [employer] and is currently on summer break and residing with her. Mother states patient has never had any previous attempts at self  harm SA use or prior mental health diagnoses. Mother states that patient's behaviors started to change over two weeks ago when patient suffered a break up with partner. Mother also reports that patient has been extremely upset over the fear of COVID and the [place] incident. Mother reports patient has been isolating and has not slept in over one week. Mother reports that patient has never displayed any aggressive behaviors in the past and has been recently "punching a brick wall outside his home." Mother reports patient has been religiously fixed and often makes bizarre statements that are associated with God and the Bible. Mother states patient as of today showed her a Face book post on how he intends to "kill the Police" over recent events. Mother reports at that time she initiated a IVC. Case was staffed with Money NP who recommended inpatient admission to assist with stabilization.   From admission H&P: This is the first psychiatric admission here or elsewhere for Bruce James, 18 year old student who presents with a 2-week history of new onset psychosis, hyper religious statements and violent threats.  He tells me he is here "to get his anger under control" he also elaborates that he "loves everybody and when I see people I love them and tell them" so he has had these types of statements as well as hyper religious statements since his presentation. He was initially brought under petition for involuntary commitment on 6/27 monitor through the emergency department until a bed opened up. He has received partial treatment therefore. Drug screen negative on presentation 6/28 collected.  Bruce James is a 18 year old male with no psychiatric history who was admitted under IVC for psychosis as described above. He first received treatment in ED and observation and then [facility]. He was started on Risperdal, Cogentin, Restoril, and Lovaza. He participated in group therapy on the  unit. He has shown improvement with stable mood, improved sleep, more organized speech/behavior, calm and cooperative and interacting appropriately. He denies SI/HI/AVH and contracts for safety. He shows no signs of responding to internal stimuli. No agitated or disruptive behaviors on the unit. Collateral information was obtained from his mother, who denies safety concerns for discharge. He agrees to follow up at [facility] and [facility] (see below). He is provided with prescriptions for medications. He is being picked up by family for discharge home.  Physical Findings: AIMS: Facial and Oral Movements Muscles of Facial Expression: None, normal Lips and Perioral Area: None, normal Jaw: None, normal Tongue: None, normal,Extremity Movements Upper (arms, wrists, hands, fingers): None, normal Lower (legs, knees, ankles, toes): None, normal, Trunk Movements Neck, shoulders, hips: None, normal, Overall Severity Severity of abnormal movements (highest score from questions above): None, normal Incapacitation due to abnormal movements: None, normal Patient's awareness of abnormal movements (rate only patient's report): No Awareness,  Dental Status Current problems with teeth and/or dentures?: No Does patient usually wear dentures?: No  CIWA:  CIWA-Ar Total: 8 COWS:  COWS Total Score: 7  Musculoskeletal: Strength & Muscle Tone: within normal limits Gait & Station: normal Patient leans: N/A  Psychiatric Specialty Exam: Physical Exam  Nursing note and vitals reviewed. Constitutional: He is oriented to person, place, and time. He appears well-developed and well-nourished.  Cardiovascular: Normal rate.  Respiratory: Effort normal.  Neurological: He is alert and oriented to person, place, and time.    Review of Systems  Constitutional: Negative.   Psychiatric/Behavioral: Negative for depression, hallucinations, substance abuse and suicidal ideas. The  patient is not nervous/anxious and does not have insomnia.     Blood pressure (!) 94/48, pulse 81, temperature 98.5 F (36.9 C), temperature source Oral, resp. rate 16, height 6' (1.829 m), weight 79.4 kg, SpO2 99 %.Body mass index is 23.73 kg/m.  General Appearance: Casual  Eye Contact:  Good  Speech:  Normal Rate  Volume:  Normal  Mood:  Euthymic  Affect:  Constricted  Thought Process:  Coherent  Orientation:  Full (Time, Place, and Person)  Thought Content:  Logical  Suicidal Thoughts:  No  Homicidal Thoughts:  No  Memory:  Immediate;   Fair Recent;   Fair  Judgement:  Fair  Insight:  Fair  Psychomotor Activity:  Normal  Concentration:  Concentration: Good  Recall:  Good  Fund of Knowledge:  Good  Language:  Good  Akathisia:  No  Handed:  Right  AIMS (if indicated):     Assets:  Communication Skills Desire for Improvement Financial Resources/Insurance Housing Leisure Time Physical Health Resilience Social Support Vocational/Educational  ADL's:  Intact  Cognition:  WNL  Sleep:  Number of Hours: 5.25     Have you used any form of tobacco in the last 30 days? (Cigarettes, Smokeless Tobacco, Cigars, and/or Pipes): No  Has this patient used any form of tobacco in the last 30 days? (Cigarettes, Smokeless Tobacco, Cigars, and/or Pipes)  No  Blood Alcohol level:  No results found for: [facility]  Metabolic Disorder Labs:  Lab Results  Component Value Date   HGBA1C 5.1 12/02/2018   MPG 99.67 12/02/2018   No results found for: PROLACTIN No results found for: CHOL, TRIG, HDL, CHOLHDL, VLDL, LDLCALC  See Psychiatric Specialty Exam and Suicide Risk Assessment completed by Attending Physician prior to discharge.  Discharge destination:  Home  Is patient on multiple antipsychotic therapies at discharge:  No   Has Patient had three or more failed trials of antipsychotic monotherapy by history:  No  Recommended Plan for Multiple Antipsychotic Therapies: NA   Allergies  as of 12/07/2018   No Known Allergies     Medication List    STOP taking these medications   naproxen 375 MG tablet Commonly known as: NAPROSYN     TAKE these medications     Indication  benztropine 1 MG tablet Commonly known as: COGENTIN Take 1 tablet (1 mg total) by mouth 2 (two) times daily.  Indication: Extrapyramidal Reaction caused by Medications   EnLyte Caps 1 a day If not covered go to [email or web address] to fill  Indication: 21-Hydroxylase Deficiency, Listlessness   ibuprofen 600 MG tablet Commonly known as: ADVIL Take 1 tablet (600 mg total) by mouth every 6 (six) hours as needed.  Indication: Pain   omega-3 acid ethyl esters 1 g capsule Commonly known as: LOVAZA Take 1 capsule (1 g total) by mouth 2 (two) times daily.  Indication: High Amount of Triglycerides in the Blood   risperiDONE 3 MG tablet Commonly known as: RISPERDAL 2 at hs x 5 days  Then 1 and 1/2 at hs then on  Indication: Schizophrenia   temazepam 30 MG capsule Commonly known as: RESTORIL Take 1 capsule (30 mg total) by mouth at bedtime as needed for sleep.  Indication: Trouble Sleeping      Follow-up Information    [facility] Follow up.   Contact information: [address] [place] [phone]       [facility] Follow up.   [facility]ty[facility] Contact information: [address] [place] [address] [phone]           Follow-up recommendations: Activity as tolerated. Diet as recommended by primary care physician. Keep all scheduled follow-up appointments as recommended.   Comments:   Patient is instructed to take all prescribed medications as recommended. Report any side effects or adverse reactions to your outpatient psychiatrist. Patient is instructed to abstain from alcohol and illegal drugs while on prescription medications. In the event of worsening  symptoms, patient is instructed to call the crisis hotline, 911, or go to the nearest emergency department for evaluation and treatment.  Signed: Aldean BakerJanet E Corbyn Steedman, NP 12/07/2018, 8:57 AM

## 2018-12-07 NOTE — Progress Notes (Signed)
  [facility] Adult Case Management Discharge Plan :  Will you be returning to the same living situation after discharge:  Yes,  home At discharge, do you have transportation home?: Yes,  mom will pick up Do you have the ability to pay for your medications: Yes,  Medicaid.  Release of information consent forms completed and in the chart; letter on chart. Patient to Follow up at: Follow-up [place] Follow up.   Why: Office is closed due to the holiday. Social Civil engineer, contracting will call you Monday with appointment.  Contact information: [place] [phone] [place], [facility] Follow up.   Specialty: Behavioral Health Why: TEFL teacher made referral for services. Due to the holiday the office is closed. Social Civil engineer, contracting will contact you on Monday with appointments.  Contact information: [address] [place] [address] [phone]           Next level of care provider has access to [place] and Suicide Prevention discussed: Yes,  with mom  Have you used any form of tobacco in the last 30 days? (Cigarettes, Smokeless Tobacco, Cigars, and/or Pipes): No  Has patient been referred to the Quitline?: N/A patient is not a smoker  Patient has been referred for addiction treatment: Yes  Joellen Jersey, [place] 12/07/2018, 9:43 AM

## 2018-12-25 ENCOUNTER — Ambulatory Visit: Payer: Medicaid Other | Admitting: Internal Medicine

## 2018-12-28 ENCOUNTER — Other Ambulatory Visit: Payer: Self-pay

## 2018-12-28 ENCOUNTER — Ambulatory Visit: Payer: Medicaid Other | Attending: Family Medicine | Admitting: Family Medicine

## 2019-01-26 IMAGING — DX DG ANKLE COMPLETE 3+V*L*
3 series · 3 of 3 positions shown · non-contrast
Comparison: None.

CLINICAL DATA: Left ankle injury post fall.

EXAM:
LEFT ANKLE COMPLETE - 3+ VIEW

[ankle ap]
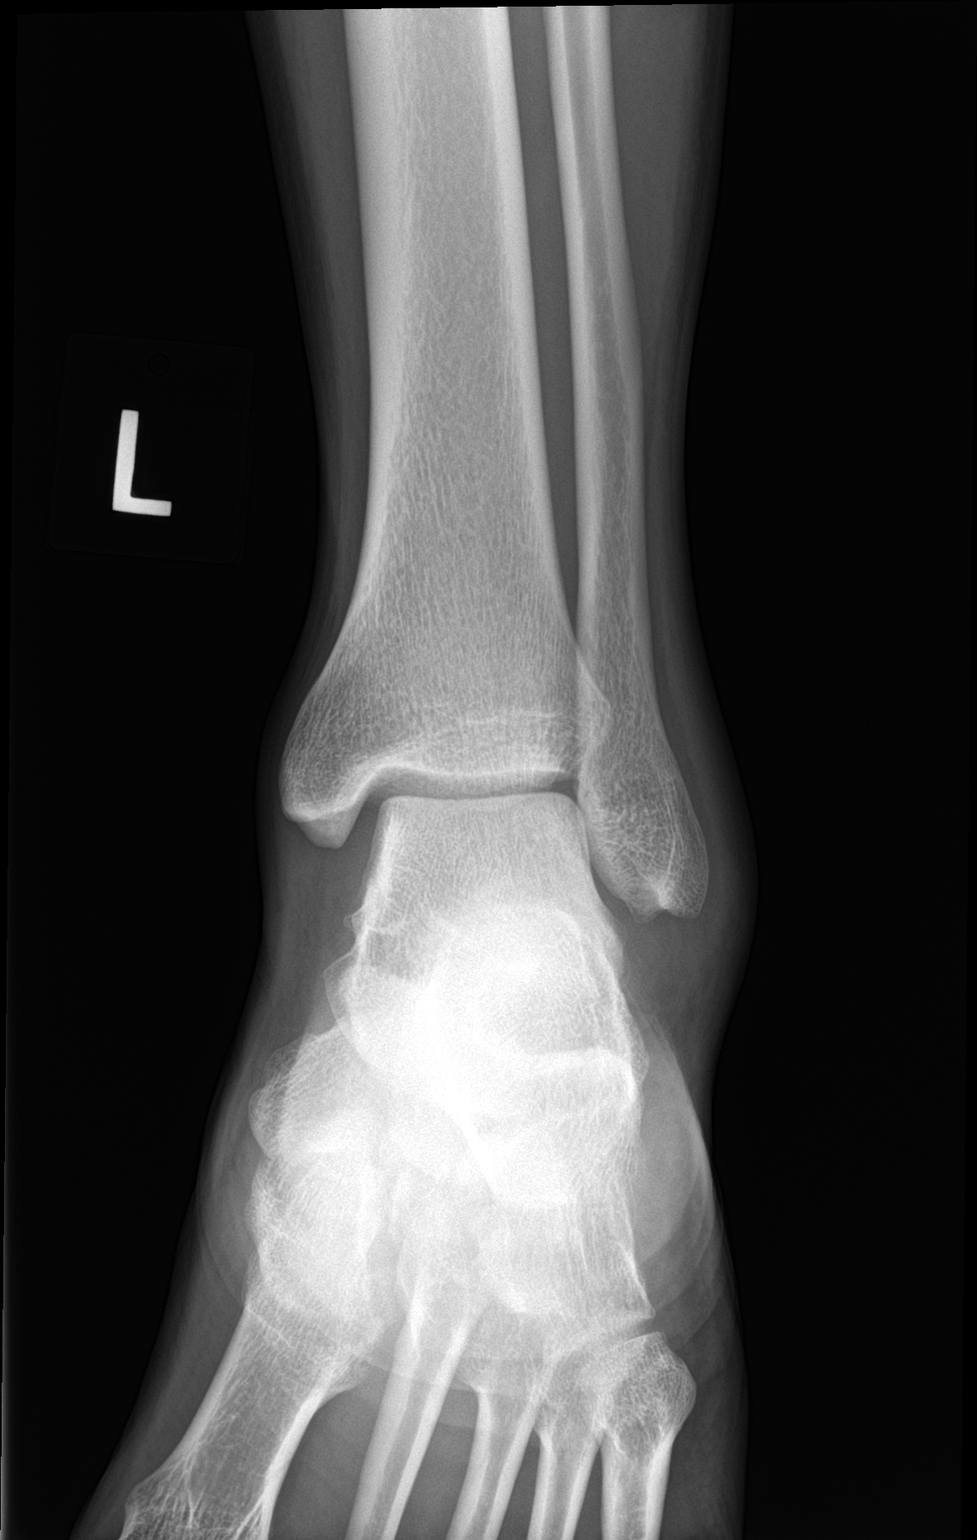

[ankle obl]
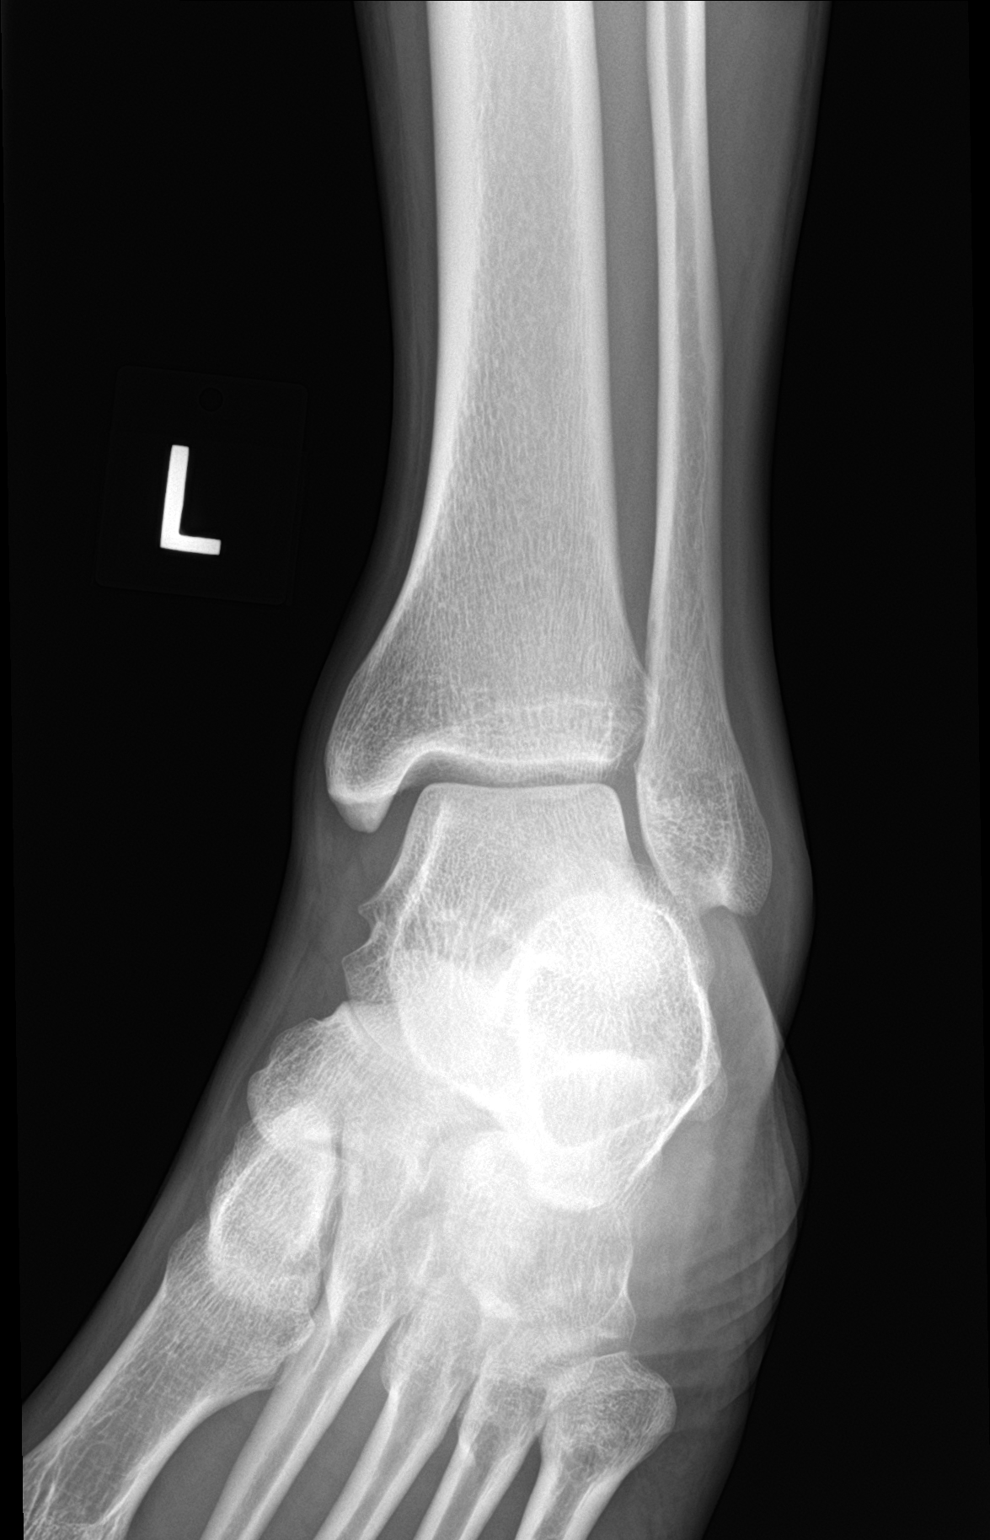

[ankle lat]
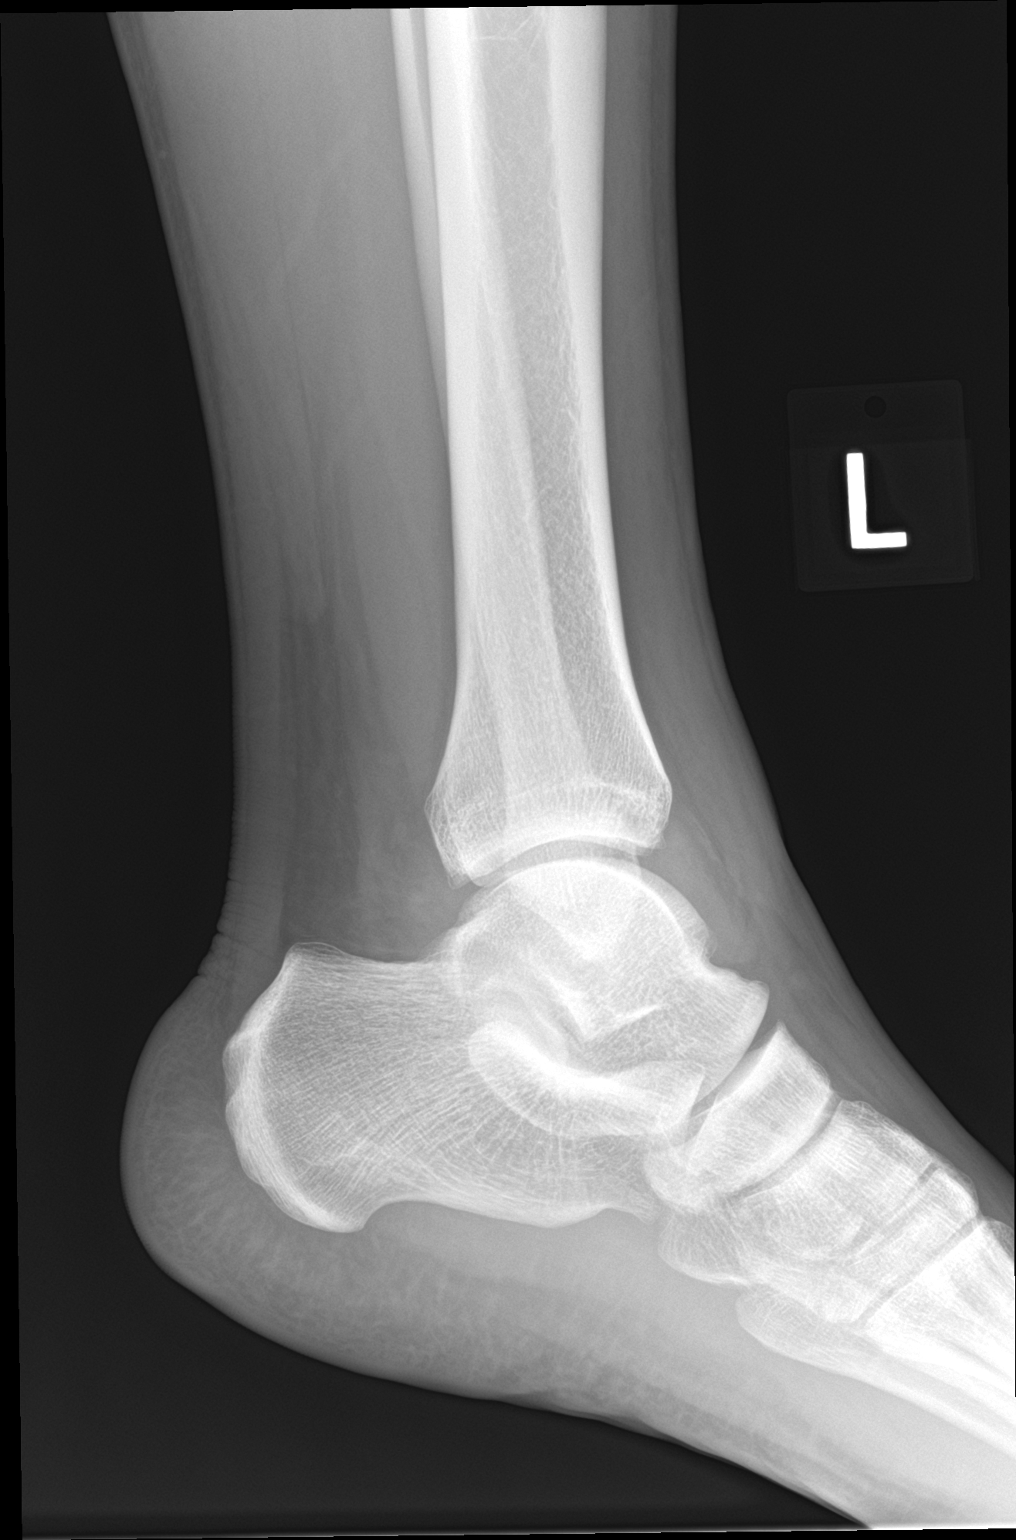

[3 of 3 positions shown; findings below may reference images not displayed]

FINDINGS: There is no evidence of fracture, dislocation, or joint effusion.
There is no evidence of arthropathy or other focal bone abnormality.
Soft tissue swelling about the lateral malleolus.
IMPRESSION: No acute fracture or dislocation identified about the left ankle.

## 2021-11-12 ENCOUNTER — Ambulatory Visit (INDEPENDENT_AMBULATORY_CARE_PROVIDER_SITE_OTHER): Payer: Medicaid Other | Admitting: Physician Assistant

## 2021-11-12 ENCOUNTER — Ambulatory Visit (INDEPENDENT_AMBULATORY_CARE_PROVIDER_SITE_OTHER): Payer: Medicaid Other | Admitting: Clinical

## 2021-11-12 VITALS — BP 131/51 | HR 62 | Ht 71.0 in | Wt 208.0 lb

## 2021-11-12 DIAGNOSIS — F902 Attention-deficit hyperactivity disorder, combined type: Secondary | ICD-10-CM

## 2021-11-12 DIAGNOSIS — F4325 Adjustment disorder with mixed disturbance of emotions and conduct: Secondary | ICD-10-CM

## 2021-11-12 DIAGNOSIS — Z8659 Personal history of other mental and behavioral disorders: Secondary | ICD-10-CM

## 2021-11-12 DIAGNOSIS — R41840 Attention and concentration deficit: Secondary | ICD-10-CM

## 2021-11-12 MED ORDER — ATOMOXETINE HCL 40 MG PO CAPS: 40.0000 mg | ORAL_CAPSULE | Freq: Every day | ORAL | 2 refills | Status: AC

## 2021-11-12 NOTE — Progress Notes (Cosign Needed Addendum)
Psychiatric Initial Adult Assessment   Patient Identification: Bruce James MRN:  [id number] Date of Evaluation:  11/15/2021 Referral Source: Walk-in Chief Complaint:   Chief Complaint  Patient presents with   Medication Management   Visit Diagnosis:    ICD-10-CM   1. History of psychosis  Z86.59     2. Adjustment disorder with mixed disturbance of emotions and conduct  F43.25     3. Attention and concentration deficit  R41.840 atomoxetine (STRATTERA) 40 MG capsule      History of Present Illness:    Bruce James is a 21 year old male with a past psychiatric history significant for acute psychosis, adjustment disorder (with mixed disturbance of emotions and conduct), and intermittent explosive disorder who [facility] medication management.  Patient reports that he has been experiencing symptoms of ADHD and wants to take his life to the next level.  Patient states that he has a defined purpose and wants to guide and help influence others at the tune of one million people.  Patient states that he has noticed behaviors in his past that were not right and wants to help himself before he can help others.  Patient is aware of his psychiatric diagnoses back in 2021 and believes that they were incorrect.  Patient denies ever having anxiety and believes his symptoms were attributed to a rough break-up/COVID-19.  Patient reports that he is currently struggling with obsessive thoughts, lack of focus, and a tendency to over think things.  He also notes difficulty with paying attention.  He denies having negative thoughts towards himself or others and states that he loves women.  He states that whenever he has conversations with women, he is accused of not listening.  He states that he has to really try hard to listen to the average person converse with him.  Patient denies depression and he further denies anxiety.  He does admit to  impulsive behavior that can lead to anxiety.  An example that the patient gives for impulsive behavior includes disrespecting the mother of his child.  Patient denies delusions or false beliefs.  He further denies hallucinations.  He does admit to sending text messages that he does not mean regarding his emotions.  Patient believes that his thoughts are always different from others.  He adds that he has many thoughts running through his head which makes it difficult for him to write or type.  Patient acknowledges that he has been hospitalized but attributes his hospitalization to going through a rough break-up and COVID-19 symptoms.  He states that his family had him hospitalized due to concerns about the patient not being himself.  During his hospitalization, patient said that he was struggling with insomnia but denied having hallucinations or thoughts of wanting to harm himself or others.  He states that he was overly anxious at the time and wanted things to happen at his time.  Patient denies a past history of suicide attempts and further denies self-harm.  During his hospitalization that occurred between 12/01/2018 and 12/07/2018, patient was placed on temazepam and risperidone.  A GAD-7 screen was performed with the patient scoring 6.  Patient is alert and oriented x 4, pleasant, calm, cooperative, and fully engaged in conversation during the encounter.  Patient denies suicidal or homicidal ideation.  He further denies auditory or visual hallucinations and does not appear to be responding to internal/external stimuli.  Patient endorses good sleep and receives on average 6 to 7  hours of sleep each night.  Patient endorses good appetite and eats 2 meals a day.  Patient denies alcohol consumption, tobacco use, and illicit drug use.  Associated Signs/Symptoms: Depression Symptoms:  psychomotor agitation, psychomotor retardation, feelings of worthlessness/guilt, difficulty concentrating, impaired  memory, disturbed sleep, (Hypo) Manic Symptoms:  Distractibility, Flight of Ideas, Licensed conveyancer, Impulsivity, Sexually Inapproprite Behavior, Anxiety Symptoms:  Specific Phobias, Psychotic Symptoms:   None PTSD Symptoms: Had a traumatic exposure:  Patient denies traumatic experiences Had a traumatic exposure in the last month:  N/A Re-experiencing:  Flashbacks Hypervigilance:  No Hyperarousal:  None Avoidance:  Decreased Interest/Participation  Past Psychiatric History:  Patient has be diagnosed with acute psychosis in the past Patient has also been diagnosed with adjustment disorder with mixed disturbance of emotions and conduct and intermittent explosive disorder  Previous Psychotropic Medications: Yes   Substance Abuse History in the last 12 months:  No.  Consequences of Substance Abuse: Negative  Past Medical History: History reviewed. No pertinent past medical history. History reviewed. No pertinent surgical history.  Family Psychiatric History:  Patient denies a family history of psychiatric illness  Family History: History reviewed. No pertinent family history.  Social History:   Social History   Socioeconomic History   Marital status: Single    Spouse name: Not on file   Number of children: Not on file   Years of education: Not on file   Highest education level: Not on file  Occupational History   Not on file  Tobacco Use   Smoking status: Never   Smokeless tobacco: Never  Substance and Sexual Activity   Alcohol use: No   Drug use: No   Sexual activity: Not on file  Other Topics Concern   Not on file  Social History Narrative   Not on file   Social Determinants of Health   Financial Resource Strain: Not on file  Food Insecurity: Not on file  Transportation Needs: Not on file  Physical Activity: Not on file  Stress: Not on file  Social Connections: Not on file    Additional Social History:   Allergies:  No Known  Allergies  Metabolic Disorder Labs: Lab Results  Component Value Date   HGBA1C 5.1 12/02/2018   MPG 99.67 12/02/2018   No results found for: "PROLACTIN" No results found for: "CHOL", "TRIG", "HDL", "CHOLHDL", "VLDL", "LDLCALC" Lab Results  Component Value Date   TSH 1.150 12/02/2018    Therapeutic Level Labs: No results found for: "LITHIUM" No results found for: "CBMZ" No results found for: "VALPROATE"  Current Medications: Current Outpatient Medications  Medication Sig Dispense Refill   atomoxetine (STRATTERA) 40 MG capsule Take 1 capsule (40 mg total) by mouth daily. 30 capsule 2   benztropine (COGENTIN) 1 MG tablet Take 1 tablet (1 mg total) by mouth 2 (two) times daily. 60 tablet 2   Dietary Management Product (ENLYTE) CAPS 1 a day If not covered go to [email or web address] to fill 30 capsule 11   ibuprofen (ADVIL,MOTRIN) 600 MG tablet Take 1 tablet (600 mg total) by mouth every 6 (six) hours as needed. (Patient not taking: Reported on 12/02/2018) 20 tablet 0   omega-3 acid ethyl esters (LOVAZA) 1 g capsule Take 1 capsule (1 g total) by mouth 2 (two) times daily. 60 capsule 2   risperiDONE (RISPERDAL) 3 MG tablet 2 at hs x 5 days  Then 1 and 1/2 at hs then on 60 tablet 1   temazepam (RESTORIL) 30 MG capsule Take 1 capsule (30  mg total) by mouth at bedtime as needed for sleep. 30 capsule 0   No current facility-administered medications for this visit.    Musculoskeletal: Strength & Muscle Tone: within normal limits Gait & Station: normal Patient leans: N/A  Psychiatric Specialty Exam: Review of Systems  Psychiatric/Behavioral:  Positive for decreased concentration. Negative for dysphoric mood, hallucinations, self-injury, sleep disturbance and suicidal ideas. The patient is nervous/anxious. The patient is not hyperactive.     Blood pressure (!) 131/51, pulse 62, height 5\' 11"  (1.803 m), weight 208 lb (94.3 kg), SpO2 100 %.Body mass index is 29.01 kg/m.  General Appearance:  Casual  Eye Contact:  Good  Speech:  Clear and Coherent and Normal Rate  Volume:  Normal  Mood:  Anxious and Euthymic  Affect:  Appropriate and Congruent  Thought Process:  Coherent, Goal Directed, and Descriptions of Associations: Intact  Orientation:  Full (Time, Place, and Person)  Thought Content:  WDL  Suicidal Thoughts:  No  Homicidal Thoughts:  No  Memory:  Immediate;   Good Recent;   Good Remote;   Good  Judgement:  Good  Insight:  Good  Psychomotor Activity:  Normal  Concentration:  Concentration: Good and Attention Span: Good  Recall:  Good  Fund of Knowledge:Good  Language: Good  Akathisia:  No  Handed:  Left  AIMS (if indicated):  not done  Assets:  Communication Skills Desire for Improvement Housing Social Support Transportation Vocational/Educational  ADL's:  Intact  Cognition: WNL  Sleep:  Good   Screenings: AIMS    Flowsheet Row [facility]  AIMS Total Score 0      AUDIT    Flowsheet Row Admission (Discharged) [facility]  Alcohol Use Disorder Identification Test Final Score (AUDIT) 0      GAD-7    Flowsheet Row Counselor from 11/12/2021 in 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  Patient presents with a chief complaint of symptoms related to ADHD.  He reports having obsessive thoughts, lack of focus, and a tendency to over think.  He also admits to having racing thoughts and impulsive behavior.  Patient has a past history of hospitalization at [facility] and was given the diagnosis of acute psychosis adjustment disorder.  Patient denies paranoia, delusional thoughts, auditory or visual hallucinations.  Patient was recommended Strattera 40 mg daily for the management of his lack of focus and concentration.  Patient is agreeable to recommendation.  Patient's medication to be prescribed to pharmacy of choice.  Collaboration of Care: Medication Management AEB provider managing patient's psychiatric medication and Psychiatrist AEB patient being followed by a mental health provider  Patient/Guardian was advised Release of Information must  be obtained prior to any record release in order to collaborate their care with an outside provider. Patient/Guardian was advised if they have not already done so to contact the registration department to sign all necessary forms in order for [place] to release information regarding their care.   Consent: Patient/Guardian gives verbal consent for treatment and assignment of benefits for services provided during this visit. Patient/Guardian expressed understanding and agreed to proceed.   1. History of psychosis  2. Adjustment disorder with mixed disturbance of emotions and conduct  3. Attention and concentration deficit  - atomoxetine (STRATTERA) 40 MG capsule; Take 1 capsule (40 mg total) by mouth daily.  Dispense: 30 capsule; Refill: 2  Patient to follow up in 2 months Provider spent a total of 44 minutes with the patient/reviewing patient's chart  Meta Hatchet, PA 6/12/202311:06 PM

## 2021-11-14 NOTE — Progress Notes (Signed)
Comprehensive Clinical Assessment (CCA) Note  11/12/2021 Bruce James [id number]  Chief Complaint:  Chief Complaint  Patient presents with   Depression   Anxiety   Visit Diagnosis:   ADHD, combined type  Interpretive Summary: Client is a 21 year old male presenting to the [facility] for outpatient services.  Client reported he is referred by family and friends for clinical assessment.  Client reported he has a previous diagnosis in elementary school of ADHD which he was prescribed medication for but does not recall the name.  Client reported currently has an adult experiencing overwhelming thoughts, feeling easily distracted, fidgety, and impulsive behaviors.  Client reported his impulsive behaviors include not being faithful in his relationship, having sporadic conversations that may not make sense to the topic at hand, and having burst of excitement that may cause him to feel scattered and confused.  Client reported with his impulsiveness describing it as him knowing the right thing to do but will go with what he feels he wants to do in the moment.  Client reported he has a lot of high energy.  Client denied illicit substance use.  Client reported 1 previous hospitalization in 2021 following a break-up in his relationship. Client presented oriented x5, appropriately dressed, and friendly.  Client denied hallucinations, delusions, suicidal and homicidal ideations.  Client was screened for pain, nutrition, [place] suicide severity and the following S DOH:    11/12/2021    8:36 AM  GAD 7 : Generalized Anxiety Score  Nervous, Anxious, on Edge 2  Control/stop worrying 0  Worry too much - different things 0  Trouble relaxing 1  Restless 3  Easily annoyed or irritable 0  Afraid - awful might happen 0  Total GAD 7 Score 6  Anxiety Difficulty Not difficult at all     Treatment recommendations are: Psychiatric evaluation for medication management.  Individual  counseling was declined at this time.  Therapist provided information on format of appointment (virtual or face to face).   The client was advised to call back or seek an in-person evaluation if the symptoms worsen or if the condition fails to improve as anticipated before the next scheduled appointment. Client was in agreement with treatment recommendations.     CCA Biopsychosocial Intake/Chief Complaint:  Client presents as a walk in for the symptoms of anxiety.  Current Symptoms/Problems: Client reported overwhelming thoughts, feeling easily distracted, fidgety, impulsive behaviors, sporadic conversations, high energy  Patient Reported Schizophrenia/Schizoaffective Diagnosis in Past: No  Strengths: willingly seeking services  Preferences: Psychiatry for medication management  Abilities: Able to articulate symptoms  Type of Services Patient Feels are Needed: No data recorded  Initial Clinical Notes/Concerns: No data recorded  Mental Health Symptoms Depression:   None   Duration of Depressive symptoms: No data recorded  Mania:   None   Anxiety:    Difficulty concentrating   Psychosis:   None   Duration of Psychotic symptoms: No data recorded  Trauma:   None   Obsessions:   None   Compulsions:   None   Inattention:   Symptoms before age 37; Disorganized; Forgetful; Poor follow-through on tasks; Does not seem to listen   Hyperactivity/Impulsivity:   Symptoms present before age 97; Talks excessively; Always on the go; Fidgets with hands/feet   Oppositional/Defiant Behaviors:   None   Emotional Irregularity:   None   Other Mood/Personality Symptoms:  No data recorded   Mental Status Exam Appearance and self-care  Stature:   Tall  Weight:   Average weight   Clothing:   Casual   Grooming:   Normal   Cosmetic use:   Age appropriate   Posture/gait:   Normal   Motor activity:   Not Remarkable   Sensorium  Attention:   Normal    Concentration:   Normal   Orientation:   X5   Recall/memory:   Normal   Affect and Mood  Affect:   Congruent   Mood:   Euthymic   Relating  Eye contact:   Normal   Facial expression:   Responsive   Attitude toward examiner:   Cooperative   Thought and Language  Speech flow:  Clear and Coherent   Thought content:   Appropriate to Mood and Circumstances   Preoccupation:   None   Hallucinations:   None   Organization:  No data recorded  [employer] of Knowledge:   Good   Intelligence:   Average   Abstraction:   Normal   Judgement:   Fair   Dance movement psychotherapist:   Adequate   Insight:   Good   Decision Making:   Impulsive; Normal   Social Functioning  Social Maturity:   Responsible   Social Judgement:   Normal   Stress  Stressors:   Transitions   Coping Ability:   Set designer Deficits:   Self-control; Decision making   Supports:   Family     Religion: Religion/Spirituality Are You A Religious Person?: No  Leisure/Recreation: Leisure / Recreation Do You Have Hobbies?: Yes  Exercise/Diet: Exercise/Diet Do You Exercise?: Yes What Type of Exercise Do You Do?: Weight Training How Many Times a Week Do You Exercise?: 4-5 times a week Have You Gained or Lost A Significant Amount of Weight in the Past Six Months?: No Do You Follow a Special Diet?: No Do You Have Any Trouble Sleeping?: No   CCA Employment/Education Employment/Work Situation: Employment / Work Situation Employment Situation: Unemployed  Education: Education Did Garment/textile technologist From [employer]?: Yes Did Theme park manager?: Yes What Type of College Degree Do you Have?: Client reported going to school at [employer] but he did not finish Did You Have Any Difficulty At [employer]?: Yes Were Any Medications Ever Prescribed For These Difficulties?: Yes   CCA Family/Childhood History Family and Relationship History: Family history Marital status:  Single Does patient have children?: Yes How many children?: 1 How is patient's relationship with their children?: Client reported he is expecting his first child with his ex-girlfriend.  Childhood History:  Childhood History By whom was/is the patient raised?: Both parents Additional childhood history information: Client reported he is from [place] and was raised by his stepfather and mother.  Client reported his childhood was good and he has a good relationship with both parents. Does patient have siblings?: Yes Number of Siblings: 7 Did patient suffer any verbal/emotional/physical/sexual abuse as a child?: No Did patient suffer from severe childhood neglect?: No Has patient ever been sexually abused/assaulted/raped as an adolescent or adult?: No Was the patient ever a victim of a crime or a disaster?: No Witnessed domestic violence?: No Has patient been affected by domestic violence as an adult?: No  Child/Adolescent Assessment:     CCA Substance Use Alcohol/Drug Use: Alcohol / Drug Use History of alcohol / drug use?: No history of alcohol / drug abuse  ASAM's:  Six Dimensions of Multidimensional Assessment  Dimension 1:  Acute Intoxication and/or Withdrawal Potential:      Dimension 2:  Biomedical Conditions and Complications:      Dimension 3:  Emotional, Behavioral, or Cognitive Conditions and Complications:     Dimension 4:  Readiness to Change:     Dimension 5:  Relapse, Continued use, or Continued Problem Potential:     Dimension 6:  Recovery/Living Environment:     ASAM Severity Score:    ASAM Recommended Level of Treatment:     Substance use Disorder (SUD)    Recommendations for Services/Supports/Treatments: Recommendations for Services/Supports/Treatments Recommendations For Services/Supports/Treatments: Medication Management  DSM5 Diagnoses: Patient Active Problem List   Diagnosis Date Noted   Attention and  concentration deficit 11/12/2021   History of psychosis 11/12/2021   Acute psychosis ([name])    Adjustment disorder with mixed disturbance of emotions and conduct 12/02/2018   Intermittent explosive disorder 12/02/2018    Patient Centered Plan: Patient is on the following Treatment Plan(s):  Anxiety   Referrals to Alternative Service(s): Referred to Alternative Service(s):   Place:   Date:   Time:    Referred to Alternative Service(s):   Place:   Date:   Time:    Referred to Alternative Service(s):   Place:   Date:   Time:    Referred to Alternative Service(s):   Place:   Date:   Time:      Collaboration of Care: Medication Management [facility]EB [facility]  Patient/Guardian was advised Release of Information must be obtained prior to any record release in order to collaborate their care with an outside provider. Patient/Guardian was advised if they have not already done so to contact the registration department to sign all necessary forms in order for us to release information regarding their care.   Consent: Patient/Guardian gives verbal consent for treatment and assignment of benefits for services provided during this visit. Patient/Guardian expressed understanding and agreed to proceed.   Neena RhymesPaige Y Toan Mort, LCSW

## 2021-11-15 ENCOUNTER — Encounter (HOSPITAL_COMMUNITY): Payer: Self-pay | Admitting: Physician Assistant

## 2022-01-18 ENCOUNTER — Encounter (HOSPITAL_COMMUNITY): Payer: Medicaid Other | Admitting: Physician Assistant

## 2022-07-09 ENCOUNTER — Ambulatory Visit
Admission: EM | Admit: 2022-07-09 | Discharge: 2022-07-09 | Disposition: A | Discharge status: Home / Self Care | Payer: Self-pay | Attending: Urgent Care | Admitting: Urgent Care

## 2022-07-09 DIAGNOSIS — R5383 Other fatigue: Secondary | ICD-10-CM

## 2022-07-09 DIAGNOSIS — F05 Delirium due to known physiological condition: Secondary | ICD-10-CM

## 2022-07-09 HISTORY — DX: Bipolar disorder, unspecified: F31.9

## 2022-07-09 LAB — POCT FASTING CBG KUC MANUAL ENTRY: POCT Glucose (KUC): 97 mg/dL (ref 70–99)

## 2022-07-09 NOTE — ED Provider Notes (Signed)
[facility]  Note:  This document was prepared using Systems analyst and may include unintentional dictation errors.  MRN: [id number] DOB: Sep 24, 2000  Subjective:   Bruce James is a 22 y.o. male presenting for consideration for blood work. Was released from jail yesterday at 3pm. Presents with his mother today who has significant concerns and wants complete blood work.  Reports that while he was in jail, he did not eat or drink.  He had episodes of incontinence.  Reports that since he was released, they have tried to get him to hydrate and eat some meals.  The patient himself is not saying much about any of his concerns.  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  Was admitted and started on psychiatric medications.  He was subsequently discontinued from this regimen per his mother.  No current facility-administered medications for this encounter.  Current Outpatient Medications:    atomoxetine (STRATTERA) 40 MG capsule, Take 1 capsule (40 mg total) by mouth daily., Disp: 30 capsule, Rfl: 2   benztropine (COGENTIN) 1 MG tablet, Take 1 tablet (1 mg total) by mouth 2 (two) times daily., Disp: 60 tablet, Rfl: 2   Dietary Management Product (ENLYTE) CAPS, 1 a day If not covered go to [email or web address] to fill, Disp: 30 capsule, Rfl: 11   ibuprofen (ADVIL,MOTRIN) 600 MG tablet, Take 1 tablet (600 mg total) by mouth every 6 (six) hours as needed. (Patient not taking: Reported on 12/02/2018), Disp: 20 tablet, Rfl: 0   omega-3 acid ethyl esters (LOVAZA) 1 g capsule, Take 1 capsule (1 g total) by mouth 2 (two) times daily., Disp: 60 capsule, Rfl: 2   risperiDONE (RISPERDAL) 3 MG tablet, 2 at hs x 5 days  Then 1 and 1/2 at hs then on, Disp: 60 tablet, Rfl: 1   temazepam (RESTORIL) 30 MG capsule, Take 1 capsule (30 mg total) by mouth at bedtime as needed for sleep., Disp: 30 capsule, Rfl: 0   No Known Allergies  Past Medical History:   Diagnosis Date   Bipolar disease, chronic ([place])     History reviewed. No pertinent surgical history.  No family history on file.  Social History   Tobacco Use   Smoking status: Never   Smokeless tobacco: Never  Vaping Use   Vaping Use: Never used  Substance Use Topics   Alcohol use: No   Drug use: No    ROS   Objective:   Vitals: BP (!) 150/78 (BP Location: Right Arm)   Pulse 83   Temp 97.6 F (36.4 C) (Oral)   Resp 16   SpO2 98%    Physical Exam Constitutional:      General: He is not in acute distress.    Appearance: Normal appearance. He is well-developed and normal weight. He is not ill-appearing, toxic-appearing or diaphoretic.  HENT:     Head: Normocephalic and atraumatic.     Right Ear: External ear normal.     Left Ear: External ear normal.     Nose: Nose normal.     Mouth/Throat:     Mouth: Mucous membranes are moist.  Eyes:     General: No scleral icterus.       Right eye: No discharge.        Left eye: No discharge.     Extraocular Movements: Extraocular movements intact.  Cardiovascular:     Rate and Rhythm: Normal rate and regular rhythm.  Heart sounds: Normal heart sounds. No murmur heard.    No friction rub. No gallop.  Pulmonary:     Effort: Pulmonary effort is normal. No respiratory distress.     Breath sounds: Normal breath sounds. No stridor. No wheezing, rhonchi or rales.  Abdominal:     General: Bowel sounds are normal. There is no distension.     Palpations: Abdomen is soft. There is no mass.     Tenderness: There is no abdominal tenderness. There is no right CVA tenderness, left CVA tenderness, guarding or rebound.  Musculoskeletal:     Cervical back: Normal range of motion.  Neurological:     Mental Status: He is oriented to person, place, and time.  Psychiatric:        Attention and Perception: He does not perceive auditory or visual hallucinations.        Mood and Affect: Mood is not anxious, depressed or elated. Affect  is blunt and flat. Affect is not labile, angry, tearful or inappropriate.        Speech: He is communicative. Speech is delayed. Speech is not rapid and pressured, slurred or tangential.        Behavior: Behavior is slowed and withdrawn. Behavior is not agitated, aggressive, hyperactive or combative.        Judgment: Judgment is not impulsive or inappropriate.     Results for orders placed or performed during the hospital encounter of 07/09/22 (from the past 24 hour(s))  POCT CBG (manual entry)     Status: None   Collection Time: 07/09/22 12:55 PM  Result Value Ref Range   POCT Glucose ([facility]) 97 70 - 99 mg/dL    Assessment and Plan :   PDMP not reviewed this encounter.  1. Lethargy   2. Acute confusional state     Patient is barely communicative throughout the entirety of his visit.  He was unable to provide a urine sample as he urinated just before coming to the back.  Given his lethargy, confusion, flat, blunted affect, slowed and withdrawn demeanor, recommended further evaluation through the emergency room at [facility].  My concern is that he may be having a mental health crisis in addition to possible electrolyte disturbances from significant dehydration.  Patient's mother is in agreement, will present to the emergency room by personal vehicle.   Jaynee Eagles, PA-C 07/09/22 1420

## 2022-07-09 NOTE — ED Triage Notes (Addendum)
Per mother pt was in jail x 17 days-was released yesterday-pt with weight loss, "cuts all over him"-pt answers questions at times-denies pain-pt with blank stare-slow unsteady gait

## 2022-07-09 NOTE — Discharge Instructions (Signed)
Please head to [facility] emergency room as I am concerned that you are in need of a higher level of care than we can provide in the urgent care setting. This includes consideration for point of care bloodwork, mental health evaluation. Please go straight there.

## 2022-07-09 NOTE — ED Notes (Signed)
Patient is being discharged from the Urgent Care and sent to the Emergency Department via POV . Per [place], [place], patient is in need of higher level of care due to mental health evaluation. Patient is aware and verbalizes understanding of plan of care.  Vitals:   07/09/22 1233  BP: (!) 150/78  Pulse: 83  Resp: 16  Temp: 97.6 F (36.4 C)  SpO2: 98%

## 2022-10-09 ENCOUNTER — Other Ambulatory Visit (HOSPITAL_COMMUNITY): Payer: Self-pay | Admitting: Physician Assistant

## 2022-10-09 DIAGNOSIS — R41840 Attention and concentration deficit: Secondary | ICD-10-CM
